# Patient Record
Sex: Female | Born: 1955
Health system: Southern US, Community
[De-identification: ages and names within clinical notes are randomized; demographics above are authoritative.]

## PROBLEM LIST (undated history)

## (undated) DIAGNOSIS — E785 Hyperlipidemia, unspecified: Secondary | ICD-10-CM

## (undated) DIAGNOSIS — M810 Age-related osteoporosis without current pathological fracture: Secondary | ICD-10-CM

## (undated) DIAGNOSIS — L719 Rosacea, unspecified: Secondary | ICD-10-CM

## (undated) HISTORY — DX: Hyperlipidemia, unspecified: E78.5

## (undated) HISTORY — PX: DENTAL SURGERY: SHX609

## (undated) HISTORY — DX: Age-related osteoporosis without current pathological fracture: M81.0

## (undated) HISTORY — DX: Rosacea, unspecified: L71.9

## (undated) HISTORY — PX: BREAST BIOPSY: SHX20

## (undated) HISTORY — PX: GALLBLADDER SURGERY: SHX652

---

## 2018-10-29 ENCOUNTER — Other Ambulatory Visit: Payer: Self-pay

## 2018-10-30 ENCOUNTER — Ambulatory Visit (INDEPENDENT_AMBULATORY_CARE_PROVIDER_SITE_OTHER): Payer: Medicare Other | Admitting: Family Medicine

## 2018-10-30 ENCOUNTER — Encounter: Payer: Self-pay | Admitting: Family Medicine

## 2018-10-30 ENCOUNTER — Ambulatory Visit (INDEPENDENT_AMBULATORY_CARE_PROVIDER_SITE_OTHER): Payer: Medicare Other

## 2018-10-30 VITALS — BP 103/62 | HR 70 | Temp 97.2°F | Ht 67.0 in | Wt 136.8 lb

## 2018-10-30 DIAGNOSIS — Z13820 Encounter for screening for osteoporosis: Secondary | ICD-10-CM

## 2018-10-30 DIAGNOSIS — L989 Disorder of the skin and subcutaneous tissue, unspecified: Secondary | ICD-10-CM | POA: Diagnosis not present

## 2018-10-30 DIAGNOSIS — Z1159 Encounter for screening for other viral diseases: Secondary | ICD-10-CM

## 2018-10-30 DIAGNOSIS — Z780 Asymptomatic menopausal state: Secondary | ICD-10-CM | POA: Diagnosis not present

## 2018-10-30 DIAGNOSIS — Z1239 Encounter for other screening for malignant neoplasm of breast: Secondary | ICD-10-CM | POA: Diagnosis not present

## 2018-10-30 DIAGNOSIS — Z1211 Encounter for screening for malignant neoplasm of colon: Secondary | ICD-10-CM | POA: Diagnosis not present

## 2018-10-30 DIAGNOSIS — Z23 Encounter for immunization: Secondary | ICD-10-CM

## 2018-10-30 DIAGNOSIS — Z0001 Encounter for general adult medical examination with abnormal findings: Secondary | ICD-10-CM

## 2018-10-30 DIAGNOSIS — Z0000 Encounter for general adult medical examination without abnormal findings: Secondary | ICD-10-CM | POA: Insufficient documentation

## 2018-10-30 DIAGNOSIS — Z136 Encounter for screening for cardiovascular disorders: Secondary | ICD-10-CM | POA: Diagnosis not present

## 2018-10-30 LAB — LIPID PANEL

## 2018-10-30 NOTE — Progress Notes (Signed)
BP 103/62   Pulse 70   Temp (!) 97.2 F (36.2 C) (Oral)   Ht '5\' 7"'$  (1.702 m)   Wt 136 lb 12.8 oz (62.1 kg)   BMI 21.43 kg/m    Subjective:    Patient ID: Melinda Bennett, female    DOB: March 02, 1956, 63 y.o.   MRN: [id number]  HPI: Melinda Bennett is a 63 y.o. female presenting on 10/30/2018 for New Patient (Initial Visit) ([facility]) and Establish Care   HPI Adult well exam Patient denies any chest pain, shortness of breath, headaches or vision issues, abdominal complaints, diarrhea, nausea, vomiting, or joint issues.  She has skin lesion on the side of her face near her nose that she is concerned about.  She would like this taken a look at.  It does have a rougher appearance.  She denies any other health issues besides that.  She is coming mainly to establish care with our office.  Relevant past medical, surgical, family and social history reviewed and updated as indicated. Interim medical history since our last visit reviewed. Allergies and medications reviewed and updated.  Review of Systems  Constitutional: Negative for chills and fever.  HENT: Negative for ear pain and tinnitus.   Eyes: Negative for pain.  Respiratory: Negative for cough, shortness of breath and wheezing.   Cardiovascular: Negative for chest pain, palpitations and leg swelling.  Gastrointestinal: Negative for abdominal pain, blood in stool, constipation and diarrhea.  Genitourinary: Negative for dysuria and hematuria.  Musculoskeletal: Negative for back pain and myalgias.  Skin: Negative for rash.  Neurological: Negative for dizziness, weakness and headaches.  Psychiatric/Behavioral: Negative for suicidal ideas.    Per HPI unless specifically indicated above  Social History   Socioeconomic History  . Marital status: Married    Spouse name: Not on file  . Number of children: Not on file  . Years of education: Not on file  . Highest education level: Not on file  Occupational History  . Not  on file  Social Needs  . Financial resource strain: Not on file  . Food insecurity    Worry: Not on file    Inability: Not on file  . Transportation needs    Medical: Not on file    Non-medical: Not on file  Tobacco Use  . Smoking status: Never Smoker  . Smokeless tobacco: Never Used  Substance and Sexual Activity  . Alcohol use: Never    Frequency: Never  . Drug use: Never  . Sexual activity: Yes    Comment: married  since 1982, 4 children all grown  Lifestyle  . Physical activity    Days per week: Not on file    Minutes per session: Not on file  . Stress: Not on file  Relationships  . Social Herbalist on phone: Not on file    Gets together: Not on file    Attends religious service: Not on file    Active member of club or organization: Not on file    Attends meetings of clubs or organizations: Not on file    Relationship status: Not on file  . Intimate partner violence    Fear of current or ex partner: Not on file    Emotionally abused: Not on file    Physically abused: Not on file    Forced sexual activity: Not on file  Other Topics Concern  . Not on file  Social History Narrative  . Not on file  Past Surgical History:  Procedure Laterality Date  . BREAST BIOPSY Left    1995  . GALLBLADDER SURGERY      Family History  Problem Relation Age of Onset  . Hypertension Mother   . Atrial fibrillation Mother     Allergies as of 10/30/2018   No Known Allergies     Medication List    as of October 30, 2018 11:59 PM   You have not been prescribed any medications.        Objective:    BP 103/62   Pulse 70   Temp (!) 97.2 F (36.2 C) (Oral)   Ht '5\' 7"'$  (1.702 m)   Wt 136 lb 12.8 oz (62.1 kg)   BMI 21.43 kg/m   Wt Readings from Last 3 Encounters:  10/30/18 136 lb 12.8 oz (62.1 kg)    Physical Exam Vitals signs and nursing note reviewed.  Constitutional:      General: She is not in acute distress.    Appearance: She is well-developed.  She is not diaphoretic.  Eyes:     Conjunctiva/sclera: Conjunctivae normal.  Cardiovascular:     Rate and Rhythm: Normal rate and regular rhythm.     Heart sounds: Normal heart sounds. No murmur.  Pulmonary:     Effort: Pulmonary effort is normal. No respiratory distress.     Breath sounds: Normal breath sounds. No wheezing.  Musculoskeletal: Normal range of motion.        General: No tenderness.  Skin:    General: Skin is warm and dry.     Findings: No rash.       Neurological:     Mental Status: She is alert and oriented to person, place, and time.     Coordination: Coordination normal.  Psychiatric:        Behavior: Behavior normal.         Assessment & Plan:   Problem List Items Addressed This Visit      Other   Well adult exam   Relevant Orders   CBC with Differential/Platelet (Completed)   CMP14+EGFR (Completed)   Lipid panel (Completed)    Other Visit Diagnoses    Colon cancer screening    -  Primary   Relevant Orders   Cologuard   Breast screening       Relevant Orders   MM 3D SCREEN BREAST BILATERAL   Precancerous skin lesion       Relevant Orders   Ambulatory referral to Dermatology   Need for hepatitis C screening test       Relevant Orders   Hepatitis C antibody (Completed)   Osteoporosis screening       Relevant Orders   DG [facility] DEXA    Will do a referral to dermatology for the skin lesion.  Patient is reluctant to do colonoscopy so we will schedule for Cologuard and she needs her mammogram and will do a DEXA scan as well  Follow up plan: Return in about 1 year (around 10/30/2019), or if symptoms worsen or fail to improve.  Caryl Pina, MD [facility] 11/05/2018, 9:09 PM

## 2018-10-31 LAB — LIPID PANEL
Chol/HDL Ratio: 2.8 ratio (ref 0.0–4.4)
Cholesterol, Total: 202 mg/dL — ABNORMAL HIGH (ref 100–199)
HDL: 71 mg/dL (ref >39)
LDL Calculated: 119 mg/dL — ABNORMAL HIGH (ref 0–99)
Triglycerides: 60 mg/dL (ref 0–149)
VLDL Cholesterol Cal: 12 mg/dL (ref 5–40)

## 2018-10-31 LAB — CMP14+EGFR
ALT: 20 IU/L (ref 0–32)
AST: 25 IU/L (ref 0–40)
Albumin/Globulin Ratio: 1.8 (ref 1.2–2.2)
Albumin: 4.4 g/dL (ref 3.8–4.8)
Alkaline Phosphatase: 84 IU/L (ref 39–117)
BUN/Creatinine Ratio: 24 (ref 12–28)
BUN: 15 mg/dL (ref 8–27)
Bilirubin Total: 0.3 mg/dL (ref 0.0–1.2)
CO2: 24 mmol/L (ref 20–29)
Calcium: 10.5 mg/dL — ABNORMAL HIGH (ref 8.7–10.3)
Chloride: 103 mmol/L (ref 96–106)
Creatinine, Ser: 0.62 mg/dL (ref 0.57–1.00)
GFR calc Af Amer: 109 mL/min/{1.73_m2} (ref >59)
GFR calc non Af Amer: 95 mL/min/{1.73_m2} (ref >59)
Globulin, Total: 2.4 g/dL (ref 1.5–4.5)
Glucose: 85 mg/dL (ref 65–99)
Potassium: 4 mmol/L (ref 3.5–5.2)
Sodium: 142 mmol/L (ref 134–144)
Total Protein: 6.8 g/dL (ref 6.0–8.5)

## 2018-10-31 LAB — HEPATITIS C ANTIBODY: Hep C Virus Ab: <0.1 s/co ratio (ref 0.0–0.9)

## 2018-10-31 LAB — CBC WITH DIFFERENTIAL/PLATELET
Basophils Absolute: 0 10*3/uL (ref 0.0–0.2)
Basos: 1 %
EOS (ABSOLUTE): 0.1 10*3/uL (ref 0.0–0.4)
Eos: 2 %
Hematocrit: 38.9 % (ref 34.0–46.6)
Hemoglobin: 12.9 g/dL (ref 11.1–15.9)
Immature Grans (Abs): 0 10*3/uL (ref 0.0–0.1)
Immature Granulocytes: 0 %
Lymphocytes Absolute: 1.4 10*3/uL (ref 0.7–3.1)
Lymphs: 24 %
MCH: 28.7 pg (ref 26.6–33.0)
MCHC: 33.2 g/dL (ref 31.5–35.7)
MCV: 87 fL (ref 79–97)
Monocytes Absolute: 0.5 10*3/uL (ref 0.1–0.9)
Monocytes: 8 %
Neutrophils Absolute: 3.8 10*3/uL (ref 1.4–7.0)
Neutrophils: 65 %
Platelets: 247 10*3/uL (ref 150–450)
RBC: 4.49 x10E6/uL (ref 3.77–5.28)
RDW: 12.8 % (ref 11.7–15.4)
WBC: 5.7 10*3/uL (ref 3.4–10.8)

## 2018-11-03 ENCOUNTER — Telehealth: Payer: Self-pay | Admitting: Family Medicine

## 2018-11-03 NOTE — Telephone Encounter (Signed)
Patient was seen 6/18 and got her first shingles injection.  States Friday she was having redness and a sore arm where injection was.  Saturday she states she was having body aches and joint pain. Sunday she states she felt better.  Today patient states she is having some warmth and redness but not near injection site. Should she be concerned?  Also states that she pulled off two deer ticks on her right thigh Friday.  States they were attached and that there is a red mark at the spot where the ticks were removed.  Should she be concerned about Lyme's?   Patient is out of town and will not return until Saturday.   Please Advise

## 2018-11-03 NOTE — Telephone Encounter (Signed)
lmtcb

## 2018-11-03 NOTE — Telephone Encounter (Signed)
The initial reaction sounds like a normal reaction to a vaccination, the immune system can cause that sore right where the injection was and a little bit of achiness and then it usually passes within a day or 2 which sounds very typical.  The fact that she is getting warmth and redness on another site is something that could be some other type of infection or something else going on, I would have her get this looked at as soon as possible.  If she is not here then may be going to an urgent care and having them look at it wherever she is at or sending some pictures to [place] via my chart so we can try and visualize it.  As far as the Lyme's I would have her continue to monitor the site and make sure that she does not develop any larger rashes or bull's-eye rashes and if anything changes to let us know.  I do not think the to correlate between the stuff on the arm and the tick bites and think there are 2 separate issues.  Please send pictures or go see an urgent care possible.

## 2018-11-10 DIAGNOSIS — X32XXXA Exposure to sunlight, initial encounter: Secondary | ICD-10-CM | POA: Diagnosis not present

## 2018-11-10 DIAGNOSIS — C44311 Basal cell carcinoma of skin of nose: Secondary | ICD-10-CM | POA: Diagnosis not present

## 2018-11-10 DIAGNOSIS — L570 Actinic keratosis: Secondary | ICD-10-CM | POA: Diagnosis not present

## 2018-11-10 DIAGNOSIS — Z85828 Personal history of other malignant neoplasm of skin: Secondary | ICD-10-CM | POA: Diagnosis not present

## 2018-11-10 DIAGNOSIS — Z1283 Encounter for screening for malignant neoplasm of skin: Secondary | ICD-10-CM | POA: Diagnosis not present

## 2018-11-10 DIAGNOSIS — Z08 Encounter for follow-up examination after completed treatment for malignant neoplasm: Secondary | ICD-10-CM | POA: Diagnosis not present

## 2018-11-11 DIAGNOSIS — Z1231 Encounter for screening mammogram for malignant neoplasm of breast: Secondary | ICD-10-CM | POA: Diagnosis not present

## 2018-11-12 ENCOUNTER — Other Ambulatory Visit: Payer: Self-pay

## 2018-11-13 ENCOUNTER — Encounter: Payer: Self-pay | Admitting: Family Medicine

## 2018-11-13 ENCOUNTER — Telehealth: Payer: Self-pay | Admitting: *Deleted

## 2018-11-13 ENCOUNTER — Ambulatory Visit (INDEPENDENT_AMBULATORY_CARE_PROVIDER_SITE_OTHER): Payer: Medicare Other | Admitting: Family Medicine

## 2018-11-13 ENCOUNTER — Other Ambulatory Visit: Payer: Self-pay

## 2018-11-13 VITALS — BP 125/72 | HR 86 | Temp 98.0°F | Ht 67.0 in | Wt 136.0 lb

## 2018-11-13 DIAGNOSIS — Z01411 Encounter for gynecological examination (general) (routine) with abnormal findings: Secondary | ICD-10-CM | POA: Diagnosis not present

## 2018-11-13 DIAGNOSIS — Z124 Encounter for screening for malignant neoplasm of cervix: Secondary | ICD-10-CM

## 2018-11-13 DIAGNOSIS — Z01419 Encounter for gynecological examination (general) (routine) without abnormal findings: Secondary | ICD-10-CM

## 2018-11-13 DIAGNOSIS — R21 Rash and other nonspecific skin eruption: Secondary | ICD-10-CM

## 2018-11-13 DIAGNOSIS — Z20822 Contact with and (suspected) exposure to covid-19: Secondary | ICD-10-CM

## 2018-11-13 DIAGNOSIS — W57XXXA Bitten or stung by nonvenomous insect and other nonvenomous arthropods, initial encounter: Secondary | ICD-10-CM | POA: Diagnosis not present

## 2018-11-13 DIAGNOSIS — R6889 Other general symptoms and signs: Secondary | ICD-10-CM | POA: Diagnosis not present

## 2018-11-13 NOTE — Telephone Encounter (Signed)
Pt scheduled for covid testing today @ 1:00 @ [facility]. Instructions given and order placed

## 2018-11-13 NOTE — Telephone Encounter (Signed)
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 No known exposures, but has traveled out of state.   [name] MR [id number]

## 2018-11-13 NOTE — Progress Notes (Signed)
Subjective:  Patient ID: Melinda Bennett, female    DOB: 05-13-1956, 63 y.o.   MRN: [id number]  Chief Complaint:  pap only   HPI: Melinda Bennett is a 63 y.o. female presenting on 11/13/2018 for pap only  Pt presents today for her PAP. Pt states she has been postmenopausal for several years, around 10 years. Denies any vaginal symptoms. Pt also reports she removed a tick from her right upper thigh about 2 weeks ago. Pt states after this she has myalgias. No fever, chills, abdominal pain, n/v/d, confusion, headache, or fatigue. She states she did have a red rash at the site of the bite. She states she still has a slight rash but no other symptoms.   Relevant past medical, surgical, family, and social history reviewed and updated as indicated.  Allergies and medications reviewed and updated.   History reviewed. No pertinent past medical history.  Past Surgical History:  Procedure Laterality Date  . BREAST BIOPSY Left    1995  . GALLBLADDER SURGERY      Social History   Socioeconomic History  . Marital status: Married    Spouse name: Not on file  . Number of children: Not on file  . Years of education: Not on file  . Highest education level: Not on file  Occupational History  . Not on file  Social Needs  . Financial resource strain: Not on file  . Food insecurity    Worry: Not on file    Inability: Not on file  . Transportation needs    Medical: Not on file    Non-medical: Not on file  Tobacco Use  . Smoking status: Never Smoker  . Smokeless tobacco: Never Used  Substance and Sexual Activity  . Alcohol use: Never    Frequency: Never  . Drug use: Never  . Sexual activity: Yes    Comment: married  since 1982, 4 children all grown  Lifestyle  . Physical activity    Days per week: Not on file    Minutes per session: Not on file  . Stress: Not on file  Relationships  . Social Herbalist on phone: Not on file    Gets together: Not on file    Attends religious  service: Not on file    Active member of club or organization: Not on file    Attends meetings of clubs or organizations: Not on file    Relationship status: Not on file  . Intimate partner violence    Fear of current or ex partner: Not on file    Emotionally abused: Not on file    Physically abused: Not on file    Forced sexual activity: Not on file  Other Topics Concern  . Not on file  Social History Narrative  . Not on file    No outpatient encounter medications on file as of 11/13/2018.   No facility-administered encounter medications on file as of 11/13/2018.     No Known Allergies  Review of Systems  Constitutional: Negative for activity change, appetite change, chills, fatigue, fever and unexpected weight change.  Respiratory: Negative for cough, chest tightness, shortness of breath and wheezing.   Cardiovascular: Negative for chest pain, palpitations and leg swelling.  Gastrointestinal: Negative for abdominal pain, constipation, diarrhea, nausea and vomiting.  Genitourinary: Negative for decreased urine volume, difficulty urinating, dyspareunia, dysuria, enuresis, flank pain, frequency, genital sores, hematuria, pelvic pain, urgency, vaginal bleeding, vaginal discharge and vaginal pain.  Musculoskeletal:  Positive for myalgias. Negative for arthralgias.  Skin: Positive for rash.  Neurological: Negative for dizziness, tremors, seizures, syncope, facial asymmetry, speech difficulty, weakness, light-headedness, numbness and headaches.  Psychiatric/Behavioral: Negative for confusion.  All other systems reviewed and are negative.       Objective:  BP 125/72   Pulse 86   Temp 98 F (36.7 C) (Oral)   Ht '5\' 7"'$  (1.702 m)   Wt 136 lb (61.7 kg)   BMI 21.30 kg/m    Wt Readings from Last 3 Encounters:  11/13/18 136 lb (61.7 kg)  10/30/18 136 lb 12.8 oz (62.1 kg)    Physical Exam Vitals signs and nursing note reviewed.  Constitutional:      General: She is not in acute  distress.    Appearance: Normal appearance. She is well-developed and well-groomed. She is not ill-appearing, toxic-appearing or diaphoretic.  HENT:     Head: Normocephalic and atraumatic.     Jaw: There is normal jaw occlusion.     Right Ear: Hearing normal.     Left Ear: Hearing normal.     Nose: Nose normal.     Mouth/Throat:     Lips: Pink.     Mouth: Mucous membranes are moist.     Pharynx: Oropharynx is clear. Uvula midline.  Eyes:     General: Lids are normal.     Extraocular Movements: Extraocular movements intact.     Conjunctiva/sclera: Conjunctivae normal.     Pupils: Pupils are equal, round, and reactive to light.  Neck:     Musculoskeletal: Normal range of motion and neck supple.     Thyroid: No thyroid mass, thyromegaly or thyroid tenderness.     Vascular: No carotid bruit or JVD.     Trachea: Trachea and phonation normal.  Cardiovascular:     Rate and Rhythm: Normal rate and regular rhythm.     Chest Wall: PMI is not displaced.     Pulses: Normal pulses.     Heart sounds: Normal heart sounds. No murmur. No friction rub. No gallop.   Pulmonary:     Effort: Pulmonary effort is normal. No respiratory distress.     Breath sounds: Normal breath sounds. No wheezing.  Abdominal:     General: Bowel sounds are normal. There is no distension or abdominal bruit.     Palpations: Abdomen is soft. There is no hepatomegaly or splenomegaly.     Tenderness: There is no abdominal tenderness. There is no right CVA tenderness or left CVA tenderness.     Hernia: No hernia is present. There is no hernia in the left inguinal area or right inguinal area.  Genitourinary:    General: Normal vulva.     Exam position: Lithotomy position.     Pubic Area: No rash or pubic lice.      Labia:        Right: No rash, tenderness, lesion or injury.        Left: No rash, tenderness, lesion or injury.      Urethra: No prolapse, urethral pain, urethral swelling or urethral lesion.     Vagina:  Normal.     Cervix: Normal.     Uterus: Normal.      Adnexa: Right adnexa normal and left adnexa normal.     Rectum: Normal.  Musculoskeletal: Normal range of motion.     Right lower leg: No edema.     Left lower leg: No edema.  Lymphadenopathy:     Cervical: No cervical  adenopathy.     Lower Body: No right inguinal adenopathy. No left inguinal adenopathy.  Skin:    General: Skin is warm and dry.     Capillary Refill: Capillary refill takes less than 2 seconds.     Coloration: Skin is not cyanotic, jaundiced or pale.     Findings: Rash present.       Neurological:     General: No focal deficit present.     Mental Status: She is alert and oriented to person, place, and time.     Cranial Nerves: Cranial nerves are intact.     Sensory: Sensation is intact.     Motor: Motor function is intact.     Coordination: Coordination is intact.     Gait: Gait is intact.     Deep Tendon Reflexes: Reflexes are normal and symmetric.  Psychiatric:        Attention and Perception: Attention and perception normal.        Mood and Affect: Mood and affect normal.        Speech: Speech normal.        Behavior: Behavior normal. Behavior is cooperative.        Thought Content: Thought content normal.        Cognition and Memory: Cognition and memory normal.        Judgment: Judgment normal.     Results for orders placed or performed in visit on 10/30/18  Hepatitis C antibody  Result Value Ref Range   Hep C Virus Ab <0.1 0.0 - 0.9 s/co ratio  CBC with Differential/Platelet  Result Value Ref Range   WBC 5.7 3.4 - 10.8 x10E3/uL   RBC 4.49 3.77 - 5.28 x10E6/uL   Hemoglobin 12.9 11.1 - 15.9 g/dL   Hematocrit 38.9 34.0 - 46.6 %   MCV 87 79 - 97 fL   MCH 28.7 26.6 - 33.0 pg   MCHC 33.2 31.5 - 35.7 g/dL   RDW 12.8 11.7 - 15.4 %   Platelets 247 150 - 450 x10E3/uL   Neutrophils 65 Not Estab. %   Lymphs 24 Not Estab. %   Monocytes 8 Not Estab. %   Eos 2 Not Estab. %   Basos 1 Not Estab. %    Neutrophils Absolute 3.8 1.4 - 7.0 x10E3/uL   Lymphocytes Absolute 1.4 0.7 - 3.1 x10E3/uL   Monocytes Absolute 0.5 0.1 - 0.9 x10E3/uL   EOS (ABSOLUTE) 0.1 0.0 - 0.4 x10E3/uL   Basophils Absolute 0.0 0.0 - 0.2 x10E3/uL   Immature Granulocytes 0 Not Estab. %   Immature Grans (Abs) 0.0 0.0 - 0.1 x10E3/uL  CMP14+EGFR  Result Value Ref Range   Glucose 85 65 - 99 mg/dL   BUN 15 8 - 27 mg/dL   Creatinine, Ser 0.62 0.57 - 1.00 mg/dL   GFR calc non Af Amer 95 >59 mL/min/1.73   GFR calc Af Amer 109 >59 mL/min/1.73   BUN/Creatinine Ratio 24 12 - 28   Sodium 142 134 - 144 mmol/L   Potassium 4.0 3.5 - 5.2 mmol/L   Chloride 103 96 - 106 mmol/L   CO2 24 20 - 29 mmol/L   Calcium 10.5 (H) 8.7 - 10.3 mg/dL   Total Protein 6.8 6.0 - 8.5 g/dL   Albumin 4.4 3.8 - 4.8 g/dL   Globulin, Total 2.4 1.5 - 4.5 g/dL   Albumin/Globulin Ratio 1.8 1.2 - 2.2   Bilirubin Total 0.3 0.0 - 1.2 mg/dL   Alkaline Phosphatase 84 39 - 117  IU/L   AST 25 0 - 40 IU/L   ALT 20 0 - 32 IU/L  Lipid panel  Result Value Ref Range   Cholesterol, Total 202 (H) 100 - 199 mg/dL   Triglycerides 60 0 - 149 mg/dL   HDL 71 >39 mg/dL   VLDL Cholesterol Cal 12 5 - 40 mg/dL   LDL Calculated 119 (H) 0 - 99 mg/dL   Chol/HDL Ratio 2.8 0.0 - 4.4 ratio       Pertinent labs & imaging results that were available during my care of the patient were reviewed by me and considered in my medical decision making.  Assessment & Plan:  Jazyah was seen today for pap only.  Diagnoses and all orders for this visit:  Well woman exam with routine gynecological exam Screening for cervical cancer -     IGP, Aptima HPV, rfx 16/18,45  Rash in adult Tick bite, initial encounter Resolution of body aches 1 week ago. Still has slight red raised rash to right upper thigh. Will check titers today. Will determine if additional treatment is warranted once labs result. Pt aware to report any new or worsening symptoms.  -     Lyme Ab/Western Blot Reflex -      Rocky mtn spotted fvr abs pnl(IgG+IgM)       Continue all other maintenance medications.  Follow up plan: Return if symptoms worsen or fail to improve.    The above assessment and management plan was discussed with the patient. The patient verbalized understanding of and has agreed to the management plan. Patient is aware to call the clinic if symptoms persist or worsen. Patient is aware when to return to the clinic for a follow-up visit. Patient educated on when it is appropriate to go to the emergency department.   Monia Pouch, FNP-C [facility] [phone]

## 2018-11-17 DIAGNOSIS — Z1211 Encounter for screening for malignant neoplasm of colon: Secondary | ICD-10-CM | POA: Diagnosis not present

## 2018-11-18 LAB — LYME AB/WESTERN BLOT REFLEX
LYME DISEASE AB, QUANT, IGM: <0.8 index (ref 0.00–0.79)
Lyme IgG/IgM Ab: <0.91 {ISR} (ref 0.00–0.90)

## 2018-11-18 LAB — ROCKY MTN SPOTTED FVR ABS PNL(IGG+IGM)
RMSF IgG: POSITIVE — AB
RMSF IgM: 0.75 index (ref 0.00–0.89)

## 2018-11-18 LAB — RMSF, IGG, IFA: RMSF, IGG, IFA: <1:64 {titer}

## 2018-11-19 LAB — NOVEL CORONAVIRUS, NAA: SARS-CoV-2, NAA: NOT DETECTED

## 2018-11-21 LAB — IGP, APTIMA HPV, RFX 16/18,45: HPV Aptima: NEGATIVE

## 2018-11-28 LAB — COLOGUARD: Cologuard: NEGATIVE

## 2018-12-23 DIAGNOSIS — M810 Age-related osteoporosis without current pathological fracture: Secondary | ICD-10-CM | POA: Diagnosis not present

## 2019-01-08 ENCOUNTER — Encounter: Payer: Self-pay | Admitting: Family Medicine

## 2019-01-16 ENCOUNTER — Encounter: Payer: Self-pay | Admitting: Family Medicine

## 2019-01-16 ENCOUNTER — Ambulatory Visit (INDEPENDENT_AMBULATORY_CARE_PROVIDER_SITE_OTHER): Payer: Medicare Other | Admitting: Family Medicine

## 2019-01-16 DIAGNOSIS — M810 Age-related osteoporosis without current pathological fracture: Secondary | ICD-10-CM | POA: Insufficient documentation

## 2019-01-16 MED ORDER — ALENDRONATE SODIUM 70 MG PO TABS: 70.0000 mg | ORAL_TABLET | ORAL | 11 refills | Status: DC

## 2019-01-16 NOTE — Progress Notes (Signed)
   Virtual Visit via telephone Note  I connected with Melinda Bennett on 01/16/19 at 1614 by telephone and verified that I am speaking with the correct person using two identifiers. Melinda Bennett is currently located at home and no other people are currently with her during visit. The provider, Fransisca Kaufmann Dettinger, MD is located in their office at time of visit.  Call ended at 1640  I discussed the limitations, risks, security and privacy concerns of performing an evaluation and management service by telephone and the availability of in person appointments. I also discussed with the patient that there may be a patient responsible charge related to this service. The patient expressed understanding and agreed to proceed.   History and Present Illness: Osteoporosis Patient had a bone scan that showed that she has osteoporosis with a T score of -4.3.  We discussed the importance of this and the risks of osteoporosis and she ends understanding and will start calcium and vitamin D and will also start the Fosamax to see if she can get it.  She denies any major fractures or falls and really denies any symptoms and she tries to stay active.  Currently she is up [place] taking care of her mother who is ill and keeps active physically helping care for her.  No diagnosis found.  No outpatient encounter medications on file as of 01/16/2019.   No facility-administered encounter medications on file as of 01/16/2019.     Review of Systems  Constitutional: Negative for chills and fever.  Eyes: Negative for visual disturbance.  Respiratory: Negative for chest tightness and shortness of breath.   Cardiovascular: Negative for chest pain and leg swelling.  Musculoskeletal: Negative for back pain and gait problem.  Skin: Negative for rash.  Psychiatric/Behavioral: Negative for agitation and behavioral problems.  All other systems reviewed and are negative.   Observations/Objective: Patient sounds comfortable and in no  acute distress  Assessment and Plan: Problem List Items Addressed This Visit      Musculoskeletal and Integument   Osteoporosis   Relevant Medications   alendronate (FOSAMAX) 70 MG tablet       Follow Up Instructions: Follow up for yearly in February of next year We will start Fosamax and instructed her on how to take it and what to watch for and she will follow-up for her normal yearly.   I discussed the assessment and treatment plan with the patient. The patient was provided an opportunity to ask questions and all were answered. The patient agreed with the plan and demonstrated an understanding of the instructions.   The patient was advised to call back or seek an in-person evaluation if the symptoms worsen or if the condition fails to improve as anticipated.  The above assessment and management plan was discussed with the patient. The patient verbalized understanding of and has agreed to the management plan. Patient is aware to call the clinic if symptoms persist or worsen. Patient is aware when to return to the clinic for a follow-up visit. Patient educated on when it is appropriate to go to the emergency department.    I provided 16 minutes of non-face-to-face time during this encounter.    Worthy Rancher, MD

## 2019-01-20 ENCOUNTER — Telehealth: Payer: Self-pay | Admitting: Family Medicine

## 2019-01-20 MED ORDER — ALENDRONATE SODIUM 70 MG PO TABS: 70.0000 mg | ORAL_TABLET | ORAL | 3 refills | Status: DC

## 2019-01-20 NOTE — Telephone Encounter (Signed)
Refill sent to mail order. 

## 2019-01-23 ENCOUNTER — Telehealth: Payer: Self-pay | Admitting: Family Medicine

## 2019-01-23 MED ORDER — ALENDRONATE SODIUM 70 MG PO TABS: 70.0000 mg | ORAL_TABLET | ORAL | 3 refills | Status: DC

## 2019-01-23 NOTE — Telephone Encounter (Signed)
done

## 2019-02-11 DIAGNOSIS — Z23 Encounter for immunization: Secondary | ICD-10-CM | POA: Diagnosis not present

## 2019-03-30 ENCOUNTER — Other Ambulatory Visit: Payer: Self-pay

## 2019-03-31 ENCOUNTER — Ambulatory Visit (INDEPENDENT_AMBULATORY_CARE_PROVIDER_SITE_OTHER): Payer: Medicare Other

## 2019-03-31 DIAGNOSIS — Z23 Encounter for immunization: Secondary | ICD-10-CM | POA: Diagnosis not present

## 2019-04-03 ENCOUNTER — Other Ambulatory Visit: Payer: Self-pay

## 2019-04-03 DIAGNOSIS — Z20828 Contact with and (suspected) exposure to other viral communicable diseases: Secondary | ICD-10-CM | POA: Diagnosis not present

## 2019-04-03 DIAGNOSIS — Z20822 Contact with and (suspected) exposure to covid-19: Secondary | ICD-10-CM

## 2019-04-06 LAB — NOVEL CORONAVIRUS, NAA: SARS-CoV-2, NAA: NOT DETECTED

## 2019-08-31 DIAGNOSIS — Z23 Encounter for immunization: Secondary | ICD-10-CM | POA: Diagnosis not present

## 2019-09-28 DIAGNOSIS — Z23 Encounter for immunization: Secondary | ICD-10-CM | POA: Diagnosis not present

## 2019-10-19 DIAGNOSIS — C44519 Basal cell carcinoma of skin of other part of trunk: Secondary | ICD-10-CM | POA: Diagnosis not present

## 2019-10-19 DIAGNOSIS — X32XXXD Exposure to sunlight, subsequent encounter: Secondary | ICD-10-CM | POA: Diagnosis not present

## 2019-10-19 DIAGNOSIS — L718 Other rosacea: Secondary | ICD-10-CM | POA: Diagnosis not present

## 2019-10-19 DIAGNOSIS — Z85828 Personal history of other malignant neoplasm of skin: Secondary | ICD-10-CM | POA: Diagnosis not present

## 2019-10-19 DIAGNOSIS — Z08 Encounter for follow-up examination after completed treatment for malignant neoplasm: Secondary | ICD-10-CM | POA: Diagnosis not present

## 2019-10-19 DIAGNOSIS — L570 Actinic keratosis: Secondary | ICD-10-CM | POA: Diagnosis not present

## 2019-10-19 DIAGNOSIS — L820 Inflamed seborrheic keratosis: Secondary | ICD-10-CM | POA: Diagnosis not present

## 2019-11-02 ENCOUNTER — Ambulatory Visit (INDEPENDENT_AMBULATORY_CARE_PROVIDER_SITE_OTHER): Payer: Medicare Other | Admitting: *Deleted

## 2019-11-02 DIAGNOSIS — Z0000 Encounter for general adult medical examination without abnormal findings: Secondary | ICD-10-CM | POA: Diagnosis not present

## 2019-11-02 NOTE — Progress Notes (Addendum)
MEDICARE ANNUAL WELLNESS VISIT  11/02/2019  Telephone Visit Disclaimer This Medicare AWV was conducted by telephone due to national recommendations for restrictions regarding the COVID-19 Pandemic (e.g. social distancing).  I verified, using two identifiers, that I am speaking with Melinda Bennett or their authorized healthcare agent. I discussed the limitations, risks, security, and privacy concerns of performing an evaluation and management service by telephone and the potential availability of an in-person appointment in the future. The patient expressed understanding and agreed to proceed.   Subjective:  Melinda Bennett is a 64 y.o. female patient of Dettinger, Fransisca Kaufmann, MD who had a Medicare Annual Wellness Visit today via telephone. Aanchal is Retired and lives with their spouse. she has 4 children. she reports that she is socially active and does interact with friends/family regularly. she is minimally physically active and enjoys Music therapist.  Patient Care Team: Dettinger, Fransisca Kaufmann, MD as PCP - General (Family Medicine)  Advanced Directives 11/02/2019  Does Patient Have a Medical Advance Directive? Yes  Type of Advance Directive [place]  Does patient want to make changes to medical advance directive? No - Patient declined  Copy of [place] in Chart? No - copy requested    Hospital Utilization Over the Past 12 Months: # of hospitalizations or ER visits: 0 # of surgeries: 0  Review of Systems    Patient reports that her overall health is unchanged compared to last year.  History obtained from chart review  Patient Reported Readings (BP, Pulse, CBG, Weight, etc) none  Pain Assessment Pain : No/denies pain     Current Medications & Allergies (verified) Allergies as of 11/02/2019   No Known Allergies      Medication List        Accurate as of November 02, 2019  9:29 AM. If you have any questions, ask your nurse or doctor.           STOP taking these medications    alendronate 70 MG tablet Commonly known as: FOSAMAX        History (reviewed): Past Medical History:  Diagnosis Date   Osteoporosis    Rosacea    Past Surgical History:  Procedure Laterality Date   BREAST BIOPSY Left    1995   GALLBLADDER SURGERY     Family History  Problem Relation Age of Onset   Hypertension Mother    Atrial fibrillation Mother    Social History   Socioeconomic History   Marital status: Married    Spouse name: Acupuncturist   Number of children: 4   Years of education: 16   Highest education level: Master's degree (e.g., MA, MS, MEng, MEd, MSW, MBA)  Occupational History   Not on file  Tobacco Use   Smoking status: Never Smoker   Smokeless tobacco: Never Used  Vaping Use   Vaping Use: Never used  Substance and Sexual Activity   Alcohol use: Never   Drug use: Never   Sexual activity: Yes    Comment: married  since 1982, 4 children all grown  Other Topics Concern   Not on file  Social History Narrative   Not on file   Social Determinants of Health   Financial Resource Strain: Low Risk    Difficulty of Paying Living Expenses: Not hard at all  Food Insecurity: No Food Insecurity   Worried About Charity fundraiser in the Last Year: Never true   [place] in the  Last Year: Never true  Transportation Needs: No Transportation Needs   Lack of Transportation (Medical): No   Lack of Transportation (Non-Medical): No  Physical Activity: Inactive   Days of Exercise per Week: 0 days   Minutes of Exercise per Session: 0 min  Stress: No Stress Concern Present   Feeling of Stress : Not at all  Social Connections: Socially Integrated   Frequency of Communication with Friends and Family: More than three times a week   Frequency of Social Gatherings with Friends and Family: More than three times a week   Attends Religious Services: More than 4 times per year   Active Member of [employer] or Organizations: Yes    Attends Archivist Meetings: More than 4 times per year   Marital Status: Married    Activities of Daily Living In your present state of health, do you have any difficulty performing the following activities: 11/02/2019  Hearing? N  Vision? N  Comment wears RX glasses-last eye exam 04/2019  Difficulty concentrating or making decisions? N  Walking or climbing stairs? N  Dressing or bathing? N  Doing errands, shopping? N  Preparing Food and eating ? N  Using the Toilet? N  In the past six months, have you accidently leaked urine? N  Do you have problems with loss of bowel control? N  Managing your Medications? N  Managing your Finances? N  Housekeeping or managing your Housekeeping? N  Some recent data might be hidden    Patient Education/ Literacy How often do you need to have someone help you when you read instructions, pamphlets, or other written materials from your doctor or pharmacy?: 1 - Never What is the last grade level you completed in school?: Masters Degree  Exercise Current Exercise Habits: The patient does not participate in regular exercise at present, Exercise limited by: None identified  Diet Patient reports consuming 3 meals a day and 1 snack(s) a day Patient reports that her primary diet is: Regular Patient reports that she does have regular access to food.   Depression Screen PHQ 2/9 Scores 11/02/2019 11/13/2018 10/30/2018  PHQ - 2 Score 0 0 0     Fall Risk Fall Risk  11/02/2019 11/13/2018 10/30/2018  Falls in the past year? 0 0 0     Objective:  Melinda Bennett seemed alert and oriented and she participated appropriately during our telephone visit.  Blood Pressure Weight BMI  BP Readings from Last 3 Encounters:  11/13/18 125/72  10/30/18 103/62   Wt Readings from Last 3 Encounters:  11/13/18 136 lb (61.7 kg)  10/30/18 136 lb 12.8 oz (62.1 kg)   BMI Readings from Last 1 Encounters:  11/13/18 21.30 kg/m    *Unable to obtain current vital signs,  weight, and BMI due to telephone visit type  Hearing/Vision  Melinda Bennett did not seem to have difficulty with hearing/understanding during the telephone conversation Reports that she has had a formal eye exam by an eye care professional within the past year Reports that she has not had a formal hearing evaluation within the past year *Unable to fully assess hearing and vision during telephone visit type  Cognitive Function: 6CIT Screen 11/02/2019  What Year? 0 points  What month? 0 points  What time? 0 points  Count back from 20 0 points  Months in reverse 0 points  Repeat phrase 0 points  Total Score 0   (Normal:0-7, Significant for Dysfunction: >8)  Normal Cognitive Function Screening: Yes   Immunization &  Health Maintenance Record Immunization History  Administered Date(s) Administered   Influenza,inj,Quad PF,6+ Mos 02/27/2018   Zoster Recombinat (Shingrix) 10/30/2018, 03/31/2019    Health Maintenance  Topic Date Due   COVID-19 Vaccine (1) Never done   TETANUS/TDAP  Never done   COLONOSCOPY  Never done   PNA vac Low Risk Adult (1 of 2 - PCV13) Never done   INFLUENZA VACCINE  12/13/2019   MAMMOGRAM  11/10/2020   DEXA SCAN  Completed   Hepatitis C Screening  Completed       Assessment  This is a routine wellness examination for Melinda Bennett.  Health Maintenance: Due or Overdue Health Maintenance Due  Topic Date Due   COVID-19 Vaccine (1) Never done   TETANUS/TDAP  Never done   COLONOSCOPY  Never done   PNA vac Low Risk Adult (1 of 2 - PCV13) Never done    Melinda Bennett does not need a referral for Community Assistance: Care Management:   no Social Work:    no Prescription Assistance:  no Nutrition/Diabetes Education:  no   Plan:  Personalized Goals Goals Addressed             This Visit's Progress    Increase physical activity         Personalized Health Maintenance & Screening Recommendations  Prevnar 13 TDAP Advanced Directives  Lung Cancer  Screening Recommended: no (Low Dose CT Chest recommended if Age 11-80 years, 30 pack-year currently smoking OR have quit w/in past 15 years) Hepatitis C Screening recommended: no HIV Screening recommended: no  Advanced Directives: Written information was not prepared per patient's request.  Referrals & Orders No orders of the defined types were placed in this encounter.   Follow-up Plan Follow-up with Dettinger, Fransisca Kaufmann, MD as planned Consider TDAP and Prevnar 13 at your next visit with your PCP Bring a copy of your Advanced Directives in for our records Bring a copy of your COVID vaccine card in for our records   I have personally reviewed and noted the following in the patient's chart:   Medical and social history Use of alcohol, tobacco or illicit drugs  Current medications and supplements Functional ability and status Nutritional status Physical activity Advanced directives List of other physicians Hospitalizations, surgeries, and ER visits in previous 12 months Vitals Screenings to include cognitive, depression, and falls Referrals and appointments  In addition, I have reviewed and discussed with Melinda Bennett certain preventive protocols, quality metrics, and best practice recommendations. A written personalized care plan for preventive services as well as general preventive health recommendations is available and can be mailed to the patient at her request.      Alvie Fowles, Donny Pique, LPN  5/45/6256    I have reviewed and agree with the above AWV documentation.   Evelina Dun, FNP

## 2019-11-02 NOTE — Patient Instructions (Signed)
Preventive Care 66 Years and Older, Female Preventive care refers to lifestyle choices and visits with your health care provider that can promote health and wellness. This includes:  A yearly physical exam. This is also called an annual well check.  Regular dental and eye exams.  Immunizations.  Screening for certain conditions.  Healthy lifestyle choices, such as diet and exercise. What can I expect for my preventive care visit? Physical exam Your health care provider will check:  Height and weight. These may be used to calculate body mass index (BMI), which is a measurement that tells if you are at a healthy weight.  Heart rate and blood pressure.  Your skin for abnormal spots. Counseling Your health care provider may ask you questions about:  Alcohol, tobacco, and drug use.  Emotional well-being.  Home and relationship well-being.  Sexual activity.  Eating habits.  History of falls.  Memory and ability to understand (cognition).  Work and work Statistician.  Pregnancy and menstrual history. What immunizations do I need?  Influenza (flu) vaccine  This is recommended every year. Tetanus, diphtheria, and pertussis (Tdap) vaccine  You may need a Td booster every 10 years. Varicella (chickenpox) vaccine  You may need this vaccine if you have not already been vaccinated. Zoster (shingles) vaccine  You may need this after age 33. Pneumococcal conjugate (PCV13) vaccine  One dose is recommended after age 33. Pneumococcal polysaccharide (PPSV23) vaccine  One dose is recommended after age 72. Measles, mumps, and rubella (MMR) vaccine  You may need at least one dose of MMR if you were born in 1957 or later. You may also need a second dose. Meningococcal conjugate (MenACWY) vaccine  You may need this if you have certain conditions. Hepatitis A vaccine  You may need this if you have certain conditions or if you travel or work in places where you may be exposed  to hepatitis A. Hepatitis B vaccine  You may need this if you have certain conditions or if you travel or work in places where you may be exposed to hepatitis B. Haemophilus influenzae type b (Hib) vaccine  You may need this if you have certain conditions. You may receive vaccines as individual doses or as more than one vaccine together in one shot (combination vaccines). Talk with your health care provider about the risks and benefits of combination vaccines. What tests do I need? Blood tests  Lipid and cholesterol levels. These may be checked every 5 years, or more frequently depending on your overall health.  Hepatitis C test.  Hepatitis B test. Screening  Lung cancer screening. You may have this screening every year starting at age 39 if you have a 30-pack-year history of smoking and currently smoke or have quit within the past 15 years.  Colorectal cancer screening. All adults should have this screening starting at age 36 and continuing until age 15. Your health care provider may recommend screening at age 23 if you are at increased risk. You will have tests every 1-10 years, depending on your results and the type of screening test.  Diabetes screening. This is done by checking your blood sugar (glucose) after you have not eaten for a while (fasting). You may have this done every 1-3 years.  Mammogram. This may be done every 1-2 years. Talk with your health care provider about how often you should have regular mammograms.  BRCA-related cancer screening. This may be done if you have a family history of breast, ovarian, tubal, or peritoneal cancers.  Other tests  Sexually transmitted disease (STD) testing.  Bone density scan. This is done to screen for osteoporosis. You may have this done starting at age 44. Follow these instructions at home: Eating and drinking  Eat a diet that includes fresh fruits and vegetables, whole grains, lean protein, and low-fat dairy products. Limit  your intake of foods with high amounts of sugar, saturated fats, and salt.  Take vitamin and mineral supplements as recommended by your health care provider.  Do not drink alcohol if your health care provider tells you not to drink.  If you drink alcohol: ? Limit how much you have to 0-1 drink a day. ? Be aware of how much alcohol is in your drink. In the [place] one drink equals one 12 oz bottle of beer (355 mL), one 5 oz glass of wine (148 mL), or one 1 oz glass of hard liquor (44 mL). Lifestyle  Take daily care of your teeth and gums.  Stay active. Exercise for at least 30 minutes on 5 or more days each week.  Do not use any products that contain nicotine or tobacco, such as cigarettes, e-cigarettes, and chewing tobacco. If you need help quitting, ask your health care provider.  If you are sexually active, practice safe sex. Use a condom or other form of protection in order to prevent STIs (sexually transmitted infections).  Talk with your health care provider about taking a low-dose aspirin or statin. What's next?  Go to your health care provider once a year for a well check visit.  Ask your health care provider how often you should have your eyes and teeth checked.  Stay up to date on all vaccines. This information is not intended to replace advice given to you by your health care provider. Make sure you discuss any questions you have with your health care provider. Document Revised: 04/24/2018 Document Reviewed: 04/24/2018 Elsevier Patient Education  2020 [employer].

## 2020-02-08 ENCOUNTER — Encounter: Payer: Self-pay | Admitting: Family Medicine

## 2020-02-08 ENCOUNTER — Other Ambulatory Visit: Payer: Self-pay

## 2020-02-08 ENCOUNTER — Ambulatory Visit (INDEPENDENT_AMBULATORY_CARE_PROVIDER_SITE_OTHER): Payer: Medicare Other | Admitting: Family Medicine

## 2020-02-08 VITALS — BP 100/66 | HR 85 | Temp 98.3°F | Ht 67.0 in | Wt 132.2 lb

## 2020-02-08 DIAGNOSIS — Z13220 Encounter for screening for lipoid disorders: Secondary | ICD-10-CM

## 2020-02-08 DIAGNOSIS — M810 Age-related osteoporosis without current pathological fracture: Secondary | ICD-10-CM

## 2020-02-08 DIAGNOSIS — Z131 Encounter for screening for diabetes mellitus: Secondary | ICD-10-CM

## 2020-02-08 DIAGNOSIS — Z23 Encounter for immunization: Secondary | ICD-10-CM | POA: Diagnosis not present

## 2020-02-08 DIAGNOSIS — Z0001 Encounter for general adult medical examination with abnormal findings: Secondary | ICD-10-CM | POA: Diagnosis not present

## 2020-02-08 DIAGNOSIS — Z0000 Encounter for general adult medical examination without abnormal findings: Secondary | ICD-10-CM

## 2020-02-08 NOTE — Progress Notes (Signed)
BP 100/66   Pulse 85   Temp 98.3 F (36.8 C)   Ht 5' 7" (1.702 m)   Wt 132 lb 4 oz (60 kg)   SpO2 97%   BMI 20.71 kg/m    Subjective:   Patient ID: Melinda Bennett, female    DOB: 11-28-1955, 64 y.o.   MRN: [id number]  HPI: Melinda Bennett is a 64 y.o. female presenting on 02/08/2020 for Medical Management of Chronic Issues   HPI Well adult exam and physical and recheck of chronic medical issues. Patient denies any chest pain, shortness of breath, headaches or vision issues, abdominal complaints, diarrhea, nausea, vomiting.  Patient does say she is having a little bit of problems with her right shoulder and some numbness that shoots down from her right neck down to her arm at times when she is in certain positions.  She says her shoulder will bother her at times when she is sleeping or when she is working with that.  She says she has been doing a lot of renovations around the house over the past couple years and her shoulder just causes her mild discomfort frequently.  Patient denies any other health issues or concerns.  Relevant past medical, surgical, family and social history reviewed and updated as indicated. Interim medical history since our last visit reviewed. Allergies and medications reviewed and updated.  Review of Systems  Constitutional: Negative for chills and fever.  Eyes: Negative for redness and visual disturbance.  Respiratory: Negative for chest tightness and shortness of breath.   Cardiovascular: Negative for chest pain and leg swelling.  Genitourinary: Negative for difficulty urinating and dysuria.  Musculoskeletal: Negative for back pain and gait problem.  Skin: Negative for rash.  Neurological: Negative for light-headedness and headaches.  Psychiatric/Behavioral: Negative for agitation, behavioral problems and dysphoric mood. The patient is not nervous/anxious.   All other systems reviewed and are negative.   Per HPI unless specifically indicated  above   Allergies as of 02/08/2020   No Known Allergies     Medication List       Accurate as of February 08, 2020 10:22 AM. If you have any questions, ask your nurse or doctor.        CALCIUM 1200 PO Take by mouth daily.   magnesium 30 MG tablet Take 30 mg by mouth daily.        Objective:   BP 100/66   Pulse 85   Temp 98.3 F (36.8 C)   Ht 5' 7" (1.702 m)   Wt 132 lb 4 oz (60 kg)   SpO2 97%   BMI 20.71 kg/m   Wt Readings from Last 3 Encounters:  02/08/20 132 lb 4 oz (60 kg)  11/13/18 136 lb (61.7 kg)  10/30/18 136 lb 12.8 oz (62.1 kg)    Physical Exam Vitals and nursing note reviewed.  Constitutional:      General: She is not in acute distress.    Appearance: She is well-developed. She is not diaphoretic.  Eyes:     Conjunctiva/sclera: Conjunctivae normal.  Cardiovascular:     Rate and Rhythm: Normal rate and regular rhythm.     Heart sounds: Normal heart sounds. No murmur heard.   Pulmonary:     Effort: Pulmonary effort is normal. No respiratory distress.     Breath sounds: Normal breath sounds. No wheezing.  Musculoskeletal:        General: No tenderness. Normal range of motion.  Skin:    General: Skin is  warm and dry.     Findings: No rash.  Neurological:     Mental Status: She is alert and oriented to person, place, and time.     Coordination: Coordination normal.  Psychiatric:        Behavior: Behavior normal.       Assessment & Plan:   Problem List Items Addressed This Visit      Musculoskeletal and Integument   Osteoporosis   Relevant Medications   Calcium Carbonate-Vit D-Min (CALCIUM 1200 PO)   Other Relevant Orders   CBC with Differential/Platelet   CMP14+EGFR   Lipid panel   TSH     Other   Well adult exam   Relevant Orders   CBC with Differential/Platelet   CMP14+EGFR   Lipid panel   TSH    Other Visit Diagnoses    Flu vaccine need    -  Primary   Relevant Orders   Flu Vaccine QUAD High Dose(Fluad)   Need for  Tdap vaccination       Relevant Orders   Tdap vaccine greater than or equal to 7yo IM   Lipid screening       Relevant Orders   Lipid panel   Diabetes mellitus screening       Relevant Orders   CMP14+EGFR      Continue current medication, will follow up in 1 year.  Seems to be doing well, do blood work today. Follow up plan: Return in about 1 year (around 02/07/2021), or if symptoms worsen or fail to improve.  Counseling provided for all of the vaccine components Orders Placed This Encounter  Procedures  . Flu Vaccine QUAD High Dose(Fluad)  . Tdap vaccine greater than or equal to 7yo IM  . CBC with Differential/Platelet  . CMP14+EGFR  . Lipid panel  . TSH    Joshua Dettinger, MD [facility] 02/08/2020, 10:22 AM     

## 2020-02-09 LAB — CBC WITH DIFFERENTIAL/PLATELET
Basophils Absolute: 0.1 10*3/uL (ref 0.0–0.2)
Basos: 1 %
EOS (ABSOLUTE): 0.1 10*3/uL (ref 0.0–0.4)
Eos: 1 %
Hematocrit: 44.9 % (ref 34.0–46.6)
Hemoglobin: 15.4 g/dL (ref 11.1–15.9)
Immature Grans (Abs): 0 10*3/uL (ref 0.0–0.1)
Immature Granulocytes: 0 %
Lymphocytes Absolute: 3.2 10*3/uL — ABNORMAL HIGH (ref 0.7–3.1)
Lymphs: 44 %
MCH: 29.7 pg (ref 26.6–33.0)
MCHC: 34.3 g/dL (ref 31.5–35.7)
MCV: 87 fL (ref 79–97)
Monocytes Absolute: 0.5 10*3/uL (ref 0.1–0.9)
Monocytes: 6 %
Neutrophils Absolute: 3.5 10*3/uL (ref 1.4–7.0)
Neutrophils: 48 %
Platelets: 275 10*3/uL (ref 150–450)
RBC: 5.18 x10E6/uL (ref 3.77–5.28)
RDW: 13 % (ref 11.7–15.4)
WBC: 7.3 10*3/uL (ref 3.4–10.8)

## 2020-02-09 LAB — CMP14+EGFR
ALT: 25 IU/L (ref 0–32)
AST: 25 IU/L (ref 0–40)
Albumin/Globulin Ratio: 1.9 (ref 1.2–2.2)
Albumin: 4.5 g/dL (ref 3.8–4.8)
Alkaline Phosphatase: 84 IU/L (ref 44–121)
BUN/Creatinine Ratio: 21 (ref 12–28)
BUN: 14 mg/dL (ref 8–27)
Bilirubin Total: 0.9 mg/dL (ref 0.0–1.2)
CO2: 25 mmol/L (ref 20–29)
Calcium: 10.4 mg/dL — ABNORMAL HIGH (ref 8.7–10.3)
Chloride: 103 mmol/L (ref 96–106)
Creatinine, Ser: 0.67 mg/dL (ref 0.57–1.00)
GFR calc Af Amer: 106 mL/min/{1.73_m2} (ref >59)
GFR calc non Af Amer: 92 mL/min/{1.73_m2} (ref >59)
Globulin, Total: 2.4 g/dL (ref 1.5–4.5)
Glucose: 117 mg/dL — ABNORMAL HIGH (ref 65–99)
Potassium: 3.8 mmol/L (ref 3.5–5.2)
Sodium: 142 mmol/L (ref 134–144)
Total Protein: 6.9 g/dL (ref 6.0–8.5)

## 2020-02-09 LAB — LIPID PANEL
Chol/HDL Ratio: 3.3 ratio (ref 0.0–4.4)
Cholesterol, Total: 156 mg/dL (ref 100–199)
HDL: 47 mg/dL (ref >39)
LDL Chol Calc (NIH): 93 mg/dL (ref 0–99)
Triglycerides: 86 mg/dL (ref 0–149)
VLDL Cholesterol Cal: 16 mg/dL (ref 5–40)

## 2020-02-09 LAB — TSH: TSH: 1.83 u[IU]/mL (ref 0.450–4.500)

## 2020-03-21 ENCOUNTER — Encounter: Payer: Self-pay | Admitting: Family Medicine

## 2020-03-21 DIAGNOSIS — R7301 Impaired fasting glucose: Secondary | ICD-10-CM

## 2020-04-01 ENCOUNTER — Other Ambulatory Visit: Payer: Medicare Other

## 2020-04-01 ENCOUNTER — Other Ambulatory Visit: Payer: Self-pay

## 2020-04-01 DIAGNOSIS — Z23 Encounter for immunization: Secondary | ICD-10-CM | POA: Diagnosis not present

## 2020-04-01 DIAGNOSIS — R7301 Impaired fasting glucose: Secondary | ICD-10-CM

## 2020-04-01 LAB — BAYER DCA HB A1C WAIVED: HB A1C (BAYER DCA - WAIVED): 5.2 % (ref <7.0)

## 2020-12-02 ENCOUNTER — Other Ambulatory Visit: Payer: Self-pay | Admitting: Family Medicine

## 2020-12-02 DIAGNOSIS — Z1231 Encounter for screening mammogram for malignant neoplasm of breast: Secondary | ICD-10-CM

## 2021-01-24 ENCOUNTER — Telehealth: Payer: Self-pay | Admitting: Family Medicine

## 2021-01-24 NOTE — Telephone Encounter (Signed)
Spoke to patient to schedule Medicare Annual Wellness Visit (AWV) either virtually or in office.  Patient stated she is not interested   Last AWV 11/02/19  please schedule at anytime with health coach  This should be a 45 minute visit.

## 2021-02-08 ENCOUNTER — Ambulatory Visit: Payer: Medicare Other | Admitting: Family Medicine

## 2021-06-12 ENCOUNTER — Ambulatory Visit
Admission: RE | Admit: 2021-06-12 | Discharge: 2021-06-12 | Disposition: A | Discharge status: Home / Self Care | Payer: Medicare Other | Source: Ambulatory Visit | Attending: Family Medicine | Admitting: Family Medicine

## 2021-06-12 DIAGNOSIS — Z1231 Encounter for screening mammogram for malignant neoplasm of breast: Secondary | ICD-10-CM | POA: Diagnosis not present

## 2021-06-15 ENCOUNTER — Encounter: Payer: Self-pay | Admitting: Family Medicine

## 2021-06-15 ENCOUNTER — Ambulatory Visit (INDEPENDENT_AMBULATORY_CARE_PROVIDER_SITE_OTHER): Payer: Medicare Other | Admitting: Family Medicine

## 2021-06-15 VITALS — BP 128/76 | HR 73 | Ht 67.0 in | Wt 133.0 lb

## 2021-06-15 DIAGNOSIS — Z23 Encounter for immunization: Secondary | ICD-10-CM

## 2021-06-15 DIAGNOSIS — Z1211 Encounter for screening for malignant neoplasm of colon: Secondary | ICD-10-CM | POA: Diagnosis not present

## 2021-06-15 DIAGNOSIS — Z13220 Encounter for screening for lipoid disorders: Secondary | ICD-10-CM | POA: Diagnosis not present

## 2021-06-15 DIAGNOSIS — Z0000 Encounter for general adult medical examination without abnormal findings: Secondary | ICD-10-CM | POA: Diagnosis not present

## 2021-06-15 DIAGNOSIS — Z131 Encounter for screening for diabetes mellitus: Secondary | ICD-10-CM | POA: Diagnosis not present

## 2021-06-15 DIAGNOSIS — N3941 Urge incontinence: Secondary | ICD-10-CM | POA: Diagnosis not present

## 2021-06-15 NOTE — Progress Notes (Signed)
BP 128/76    Pulse 73    Ht 5\' 7"  (1.702 m)    Wt 133 lb (60.3 kg)    SpO2 98%    BMI 20.83 kg/m    Subjective:   Patient ID: Melinda Bennett, female    DOB: 02-Nov-1955, 66 y.o.   MRN: [id number]  HPI: Melinda Bennett is a 66 y.o. female presenting on 06/15/2021 for Medical Management of Chronic Issues   HPI Urinary incont Patient's only complaint today is that she does have some urinary leakage and incontinence.  She says when she has to go she has to go and she has to make it to the restroom now and then she does get some leakage when she coughs or sneezes but more urge where she has to go when she has to go now.  She has had 4 previous vaginal deliveries and one of them was prolonged and breech.  She says that its not enough of an issue where she wants to do something about it and we discussed Kegel's and she wants to try that.  Well exam Patient denies any chest pain, shortness of breath, headaches or vision issues, abdominal complaints, diarrhea, nausea, vomiting, or joint issues.    Relevant past medical, surgical, family and social history reviewed and updated as indicated. Interim medical history since our last visit reviewed. Allergies and medications reviewed and updated.  Review of Systems  Constitutional:  Negative for chills and fever.  HENT:  Negative for ear pain and tinnitus.   Eyes:  Negative for pain and visual disturbance.  Respiratory:  Negative for cough, chest tightness, shortness of breath and wheezing.   Cardiovascular:  Negative for chest pain, palpitations and leg swelling.  Gastrointestinal:  Negative for abdominal pain, blood in stool, constipation and diarrhea.  Genitourinary:  Positive for urgency. Negative for dysuria and hematuria.  Musculoskeletal:  Negative for back pain, gait problem and myalgias.  Skin:  Negative for rash.  Neurological:  Negative for dizziness, weakness, light-headedness and headaches.  Psychiatric/Behavioral:  Negative for agitation,  behavioral problems and suicidal ideas.   All other systems reviewed and are negative.  Per HPI unless specifically indicated above   Allergies as of 06/15/2021   No Known Allergies      Medication List        Accurate as of June 15, 2021  3:05 PM. If you have any questions, ask your nurse or doctor.          CALCIUM 1200 PO Take by mouth daily.   magnesium 30 MG tablet Take 30 mg by mouth daily.         Objective:   BP 128/76    Pulse 73    Ht 5\' 7"  (1.702 m)    Wt 133 lb (60.3 kg)    SpO2 98%    BMI 20.83 kg/m   Wt Readings from Last 3 Encounters:  06/15/21 133 lb (60.3 kg)  02/08/20 132 lb 4 oz (60 kg)  11/13/18 136 lb (61.7 kg)    Physical Exam Vitals and nursing note reviewed.  Constitutional:      General: She is not in acute distress.    Appearance: Normal appearance. She is well-developed. She is not diaphoretic.  Eyes:     Conjunctiva/sclera: Conjunctivae normal.  Cardiovascular:     Rate and Rhythm: Normal rate and regular rhythm.     Heart sounds: Normal heart sounds. No murmur heard. Pulmonary:     Effort: Pulmonary effort is  normal. No respiratory distress.     Breath sounds: Normal breath sounds. No wheezing.  Abdominal:     General: Abdomen is flat. Bowel sounds are normal. There is no distension.     Palpations: Abdomen is soft.     Tenderness: There is no abdominal tenderness. There is no guarding.  Musculoskeletal:        General: No tenderness. Normal range of motion.  Skin:    General: Skin is warm and dry.     Findings: No rash.  Neurological:     Mental Status: She is alert and oriented to person, place, and time.     Coordination: Coordination normal.  Psychiatric:        Behavior: Behavior normal.      Assessment & Plan:   Problem List Items Addressed This Visit       Other   Well adult exam   Relevant Orders   CBC with Differential/Platelet   Other Visit Diagnoses     Urge incontinence of urine    -  Primary    Relevant Orders   CBC with Differential/Platelet   Need for pneumococcal vaccination       Relevant Orders   Pneumococcal conjugate vaccine 20-valent (Prevnar 20) (Completed)   Lipid screening       Relevant Orders   Lipid panel   Diabetes mellitus screening       Relevant Orders   CMP14+EGFR   Colon cancer screening       Relevant Orders   Cologuard       Will check blood work today, recommended Kegel's for her urinary incontinence and will discuss if she needs any help further than that in the future. Follow up plan: Return in about 1 year (around 06/15/2022), or if symptoms worsen or fail to improve, for Physical.  Counseling provided for all of the vaccine components Orders Placed This Encounter  Procedures   Pneumococcal conjugate vaccine 20-valent (Prevnar 20)   CBC with Differential/Platelet   CMP14+EGFR   Lipid panel   Cologuard    Caryl Pina, MD [facility] 06/15/2021, 3:05 PM

## 2021-06-16 LAB — CMP14+EGFR
ALT: 17 IU/L (ref 0–32)
AST: 21 IU/L (ref 0–40)
Albumin/Globulin Ratio: 1.9 (ref 1.2–2.2)
Albumin: 4.4 g/dL (ref 3.8–4.8)
Alkaline Phosphatase: 119 IU/L (ref 44–121)
BUN/Creatinine Ratio: 21 (ref 12–28)
BUN: 15 mg/dL (ref 8–27)
Bilirubin Total: 0.3 mg/dL (ref 0.0–1.2)
CO2: 27 mmol/L (ref 20–29)
Calcium: 10.7 mg/dL — ABNORMAL HIGH (ref 8.7–10.3)
Chloride: 101 mmol/L (ref 96–106)
Creatinine, Ser: 0.71 mg/dL (ref 0.57–1.00)
Globulin, Total: 2.3 g/dL (ref 1.5–4.5)
Glucose: 89 mg/dL (ref 70–99)
Potassium: 4.5 mmol/L (ref 3.5–5.2)
Sodium: 140 mmol/L (ref 134–144)
Total Protein: 6.7 g/dL (ref 6.0–8.5)
eGFR: 93 mL/min/{1.73_m2} (ref >59)

## 2021-06-16 LAB — LIPID PANEL
Chol/HDL Ratio: 2.7 ratio (ref 0.0–4.4)
Cholesterol, Total: 222 mg/dL — ABNORMAL HIGH (ref 100–199)
HDL: 81 mg/dL (ref >39)
LDL Chol Calc (NIH): 129 mg/dL — ABNORMAL HIGH (ref 0–99)
Triglycerides: 69 mg/dL (ref 0–149)
VLDL Cholesterol Cal: 12 mg/dL (ref 5–40)

## 2021-06-16 LAB — CBC WITH DIFFERENTIAL/PLATELET
Basophils Absolute: 0.1 10*3/uL (ref 0.0–0.2)
Basos: 1 %
EOS (ABSOLUTE): 0.1 10*3/uL (ref 0.0–0.4)
Eos: 1 %
Hematocrit: 41.8 % (ref 34.0–46.6)
Hemoglobin: 13.4 g/dL (ref 11.1–15.9)
Immature Grans (Abs): 0 10*3/uL (ref 0.0–0.1)
Immature Granulocytes: 1 %
Lymphocytes Absolute: 1.9 10*3/uL (ref 0.7–3.1)
Lymphs: 28 %
MCH: 27.7 pg (ref 26.6–33.0)
MCHC: 32.1 g/dL (ref 31.5–35.7)
MCV: 87 fL (ref 79–97)
Monocytes Absolute: 0.5 10*3/uL (ref 0.1–0.9)
Monocytes: 7 %
Neutrophils Absolute: 4.3 10*3/uL (ref 1.4–7.0)
Neutrophils: 62 %
Platelets: 266 10*3/uL (ref 150–450)
RBC: 4.83 x10E6/uL (ref 3.77–5.28)
RDW: 12.3 % (ref 11.7–15.4)
WBC: 6.9 10*3/uL (ref 3.4–10.8)

## 2021-08-02 DIAGNOSIS — U071 COVID-19: Secondary | ICD-10-CM | POA: Diagnosis not present

## 2021-08-08 ENCOUNTER — Telehealth: Payer: Self-pay | Admitting: Family Medicine

## 2021-08-09 NOTE — Telephone Encounter (Signed)
Appt made

## 2021-08-10 ENCOUNTER — Other Ambulatory Visit: Payer: Self-pay

## 2021-08-10 ENCOUNTER — Ambulatory Visit (INDEPENDENT_AMBULATORY_CARE_PROVIDER_SITE_OTHER): Payer: Medicare Other

## 2021-08-10 DIAGNOSIS — Z780 Asymptomatic menopausal state: Secondary | ICD-10-CM | POA: Diagnosis not present

## 2021-08-10 DIAGNOSIS — M810 Age-related osteoporosis without current pathological fracture: Secondary | ICD-10-CM | POA: Diagnosis not present

## 2021-08-15 DIAGNOSIS — H25013 Cortical age-related cataract, bilateral: Secondary | ICD-10-CM | POA: Diagnosis not present

## 2021-08-15 DIAGNOSIS — H18413 Arcus senilis, bilateral: Secondary | ICD-10-CM | POA: Diagnosis not present

## 2021-08-15 DIAGNOSIS — H25043 Posterior subcapsular polar age-related cataract, bilateral: Secondary | ICD-10-CM | POA: Diagnosis not present

## 2021-08-15 DIAGNOSIS — H2512 Age-related nuclear cataract, left eye: Secondary | ICD-10-CM | POA: Diagnosis not present

## 2021-08-15 DIAGNOSIS — H2513 Age-related nuclear cataract, bilateral: Secondary | ICD-10-CM | POA: Diagnosis not present

## 2021-08-29 ENCOUNTER — Ambulatory Visit (INDEPENDENT_AMBULATORY_CARE_PROVIDER_SITE_OTHER): Payer: Medicare Other | Admitting: Pharmacist

## 2021-08-29 DIAGNOSIS — M810 Age-related osteoporosis without current pathological fracture: Secondary | ICD-10-CM

## 2021-08-29 NOTE — Progress Notes (Signed)
? ? ?  08/29/2021 ?Name: Melinda Bennett MRN: [id number] DOB: 1955-07-09 ? ? ?S:  25 yoF presents for osteoporosis evaluation, education, and management.  She is a retired Marine scientist and is interested in discussing osteoporosis pharmacotherapy and goals.  She reports she is physically active, which is important to her bone health and overall health.  She is of normal body weight/habitus.  PCP has discussed the importance of starting calcium and vitamin D as well as starting bisphosphonate therapy. ? ?Current Height:   5'7"     Max Lifetime Height:  5'7" ?Current Weight:    133lbs    ? ?Ethnicity:Caucasian  ? ?HPI: ?Does pt already have a diagnosis of:  ?Osteopenia?  Yes ?Osteoporosis?  Yes ? ?Back Pain?  No       Kyphosis?  No ?Prior fracture?  No ?Med(s) for Osteoporosis/Osteopenia:  calcium/vitamin D ?Med(s) previously tried for Osteoporosis/Osteopenia:  n/a; was prescribed fosamax, but never took ?                                                            ?PMH: ?HRT? No ?Steroid Use?  No ?Thyroid med?  No ?History of cancer?  No ?History of digestive disorders (ie Crohn's)?  No ?Current or previous eating disorders?  No ?Last Vitamin D Result:  need to order at next PCP f/u ?Last GFR Result:  93 on 06/15/21 ?  ?FH/SH: ?Family history of osteoporosis? N/a ?Parent with history of hip fracture?  No ?Family history of breast cancer?  No ?Exercise?  Yes active ?Smoking?  No ?Alcohol?  No ?  ? ? ?DEXA Results 2023 ?Assessment: ?FINDINGS: ?AP LUMBAR SPINE L1-L4 ? Bone Mineral Density (BMD):  0.686 g/cm2 ? Young Adult T-Score:  -4.1 ?  Z-Score:  -2.3 ?  ?Left FEMUR neck ? Bone Mineral Density (BMD):  0.615 g/cm2 ? Young Adult T-Score: -3.0 ? Z-Score:  -1.3 ? ?DEXA from 2020 ?AP Spine Bone Density ?Densitometry: [place] (Combined NHANES/Lunar) ?                 T-score Z-score ?L1 0.647   -4.0  -2.3 ?L2 0.625   -4.8  -3.1 ?L3 0.810   -3.3  -1.6 ?L4 0.698   -4.0  -2.4 ?L1-L2 0.635   -4.4 -2.8 ?L1-L4 (L3) 0.661  -4.3  -2.6 ?L2-L4 (L3) 0.666   -4.4  -2.7 ? ?   ?Scan quality: The scan quality is good. Exclusions: None. ?  ?ASSESSMENT: Patient's diagnostic category is OSTEOPOROSIS by WHO Criteria. ?  ?FRACTURE RISK: INCREASED. ?  ?FRAX SCORE:  Not reported due to T-score at or below -2.5. ?  ?Recommendations: ?1.  Start  alendronate (FOSAMAX) '70mg'$  weekly.  Patient has supply at home, so she is going to try and see if she is able to tolerate.  ?2.  continue calcium '1200mg'$  daily through supplementation or diet.  500 IU ?3.  continue weight bearing exercise - 30 minutes at least 4 days per week.   ?4.  Counseled and educated about fall risk and prevention. ? ?Recheck DEXA:  2 years ? ?Time spent counseling patient:  20 minutes ? ?Regina Eck, PharmD, BCPS ?Clinical Pharmacist, [facility] ?[place]  [place] Phone [phone] ? ? ? ? ? ? ? ? ?

## 2021-11-13 ENCOUNTER — Other Ambulatory Visit: Payer: Self-pay | Admitting: Family Medicine

## 2021-11-13 MED ORDER — ALENDRONATE SODIUM 70 MG PO TABS: 70.0000 mg | ORAL_TABLET | ORAL | 2 refills | Status: DC

## 2021-11-13 NOTE — Telephone Encounter (Signed)
  Prescription Request  11/13/2021  Is this a "Controlled Substance" medicine? No   Have you seen your PCP in the last 2 weeks? no  If YES, route message to pool  -  If NO, patient needs to be scheduled for appointment.  What is the name of the medication or equipment? Alendronate 70 mg  Have you contacted your pharmacy to request a refill? no   Which pharmacy would you like this sent to? [facility]   Patient notified that their request is being sent to the clinical staff for review and that they should receive a response within 2 business days.

## 2021-11-13 NOTE — Telephone Encounter (Signed)
Last office visit 06/15/21 with instructions to return in one year.  Med is on med list but never prescribed from our office.  Please advise.

## 2021-11-27 DIAGNOSIS — H2512 Age-related nuclear cataract, left eye: Secondary | ICD-10-CM | POA: Diagnosis not present

## 2021-11-27 DIAGNOSIS — Z1211 Encounter for screening for malignant neoplasm of colon: Secondary | ICD-10-CM | POA: Diagnosis not present

## 2021-11-28 DIAGNOSIS — H2511 Age-related nuclear cataract, right eye: Secondary | ICD-10-CM | POA: Diagnosis not present

## 2021-12-03 LAB — COLOGUARD: COLOGUARD: NEGATIVE

## 2021-12-04 DIAGNOSIS — H2512 Age-related nuclear cataract, left eye: Secondary | ICD-10-CM | POA: Diagnosis not present

## 2021-12-11 DIAGNOSIS — H2511 Age-related nuclear cataract, right eye: Secondary | ICD-10-CM | POA: Diagnosis not present

## 2021-12-18 DIAGNOSIS — H2511 Age-related nuclear cataract, right eye: Secondary | ICD-10-CM | POA: Diagnosis not present

## 2022-01-18 ENCOUNTER — Ambulatory Visit (INDEPENDENT_AMBULATORY_CARE_PROVIDER_SITE_OTHER): Payer: Medicare Other | Admitting: Family Medicine

## 2022-01-18 ENCOUNTER — Encounter: Payer: Self-pay | Admitting: Family Medicine

## 2022-01-18 VITALS — BP 122/74 | HR 94 | Temp 97.7°F | Ht 67.0 in | Wt 134.6 lb

## 2022-01-18 DIAGNOSIS — J40 Bronchitis, not specified as acute or chronic: Secondary | ICD-10-CM | POA: Diagnosis not present

## 2022-01-18 DIAGNOSIS — J329 Chronic sinusitis, unspecified: Secondary | ICD-10-CM | POA: Diagnosis not present

## 2022-01-18 DIAGNOSIS — W57XXXA Bitten or stung by nonvenomous insect and other nonvenomous arthropods, initial encounter: Secondary | ICD-10-CM | POA: Diagnosis not present

## 2022-01-18 MED ORDER — PREDNISONE 10 MG PO TABS: ORAL_TABLET | ORAL | 0 refills | Status: DC

## 2022-01-18 MED ORDER — DOXYCYCLINE HYCLATE 100 MG PO CAPS: 100.0000 mg | ORAL_CAPSULE | Freq: Two times a day (BID) | ORAL | 0 refills | Status: DC

## 2022-01-18 NOTE — Progress Notes (Signed)
Chief Complaint  Patient presents with   Cough   Fatigue    HPI  Patient presents today for Patient presents with upper respiratory congestion. Started with [facility] for about a week. Resp. Sx started August 15 with fever, coughand fatigue. Dry cough with tightness in upper chest. except occasionally disgusting green color phlegm. Rhinorrhea that is frequently purulent. There is no sore throat. Patient reports coughing frequently as well.  Scant sputum noted. There is no fever, chills or sweats. The patient denies being short of breath. Onset was 3-5 weeks ago. Gradually worsening. Tried OTCs without improvement. Bit by tick 3 days ago. Deer tick. Got it off the same day. Right upper thigh. Also something bit her LLE in the garden the same day that caused whelps that went away overnight.  PMH: Smoking status noted ROS: Per HPI  Objective: BP 122/74   Pulse 94   Temp 97.7 F (36.5 C)   Ht '5\' 7"'$  (1.702 m)   Wt 134 lb 10.1 oz (61.1 kg)   SpO2 97%   BMI 21.09 kg/m  Gen: NAD, alert, cooperative with exam HEENT: NCAT, Nasal passages swollen ,dull and boggy. TMs clear. CV: RRR, good S1/S2, no murmur Resp: Bronchitis changes with scattered wheezes, non-labored Ext: No edema, warm Neuro: Alert and oriented, No gross deficits  Assessment and plan:  1. Sinobronchitis   2. Tick bite, initial encounter     Meds ordered this encounter  Medications   predniSONE (DELTASONE) 10 MG tablet    Sig: Take 5 daily for 2 days followed by 4,3,2 and 1 for 2 days each.    Dispense:  30 tablet    Refill:  0   doxycycline (VIBRAMYCIN) 100 MG capsule    Sig: Take 1 capsule (100 mg total) by mouth 2 (two) times daily.    Dispense:  20 capsule    Refill:  0    No orders of the defined types were placed in this encounter.   Follow up as needed.  Claretta Fraise, MD

## 2022-02-08 ENCOUNTER — Telehealth (INDEPENDENT_AMBULATORY_CARE_PROVIDER_SITE_OTHER): Payer: Medicare Other | Admitting: Family Medicine

## 2022-02-08 ENCOUNTER — Encounter: Payer: Self-pay | Admitting: Family Medicine

## 2022-02-08 DIAGNOSIS — L02512 Cutaneous abscess of left hand: Secondary | ICD-10-CM | POA: Diagnosis not present

## 2022-02-08 MED ORDER — VALACYCLOVIR HCL 1 G PO TABS: 1000.0000 mg | ORAL_TABLET | Freq: Two times a day (BID) | ORAL | 0 refills | Status: AC

## 2022-02-08 NOTE — Progress Notes (Signed)
Virtual Visit via mychart video Note  I connected with Melinda Bennett on 02/08/22 at 1644 by video and verified that I am speaking with the correct person using two identifiers. Melinda Bennett is currently located at home and patient are currently with her during visit. The provider, Fransisca Kaufmann Bijon Mineer, MD is located in their office at time of visit.  Call ended at 1650  I discussed the limitations, risks, security and privacy concerns of performing an evaluation and management service by video and the availability of in person appointments. I also discussed with the patient that there may be a patient responsible charge related to this service. The patient expressed understanding and agreed to proceed.   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  She has carried it but has only had it a few times in her life.  She denies any rash anywhere else. She has a burning sensation. She did finish prednisone a week ago and the burning sensation.  She does not have rash yet.    1. Whitlow, left     Outpatient Encounter Medications as of 02/08/2022  Medication Sig   valACYclovir (VALTREX) 1000 MG tablet Take 1 tablet (1,000 mg total) by mouth 2 (two) times daily for 10 days.   alendronate (FOSAMAX) 70 MG tablet Take 1 tablet (70 mg total) by mouth once a week. Take with a full glass of water on an empty stomach.   Calcium Carbonate-Vit D-Min (CALCIUM 1200 PO) Take by mouth daily.   doxycycline (VIBRAMYCIN) 100 MG capsule Take 1 capsule (100 mg total) by mouth 2 (two) times daily.   magnesium 30 MG tablet Take 30 mg by mouth daily.   predniSONE (DELTASONE) 10 MG tablet Take 5 daily for 2 days followed by 4,3,2 and 1 for 2 days each.   No facility-administered encounter medications on file as of 02/08/2022.    Review of Systems  Constitutional:  Negative for chills and fever.  Eyes:  Negative for visual disturbance.  Respiratory:   Negative for chest tightness and shortness of breath.   Cardiovascular:  Negative for chest pain and leg swelling.  Genitourinary:  Negative for difficulty urinating and dysuria.  Musculoskeletal:  Negative for back pain and gait problem.  Skin:  Negative for color change and rash.  Neurological:  Positive for numbness. Negative for weakness, light-headedness and headaches.  Psychiatric/Behavioral:  Negative for agitation and behavioral problems.   All other systems reviewed and are negative.   Observations/Objective: Patient sounds comfortable and in no acute distress  Assessment and Plan: Problem List Items Addressed This Visit   None Visit Diagnoses     Whitlow, left    -  Primary   Relevant Medications   valACYclovir (VALTREX) 1000 MG tablet       We will do Valtrex for her that she can take, recommended at least 2 to 3 days of it and then if not improved can be more. Follow up plan: Return if symptoms worsen or fail to improve.     I discussed the assessment and treatment plan with the patient. The patient was provided an opportunity to ask questions and all were answered. The patient agreed with the plan and demonstrated an understanding of the instructions.   The patient was advised to call back or seek an in-person evaluation if the symptoms worsen or if the condition fails to improve as anticipated.  The above assessment and  management plan was discussed with the patient. The patient verbalized understanding of and has agreed to the management plan. Patient is aware to call the clinic if symptoms persist or worsen. Patient is aware when to return to the clinic for a follow-up visit. Patient educated on when it is appropriate to go to the emergency department.    I provided 6 minutes of non-face-to-face time during this encounter.    Worthy Rancher, MD

## 2022-03-12 DIAGNOSIS — H26492 Other secondary cataract, left eye: Secondary | ICD-10-CM | POA: Diagnosis not present

## 2022-03-12 DIAGNOSIS — Z961 Presence of intraocular lens: Secondary | ICD-10-CM | POA: Diagnosis not present

## 2022-06-07 DIAGNOSIS — H02831 Dermatochalasis of right upper eyelid: Secondary | ICD-10-CM | POA: Diagnosis not present

## 2022-06-07 DIAGNOSIS — Z961 Presence of intraocular lens: Secondary | ICD-10-CM | POA: Diagnosis not present

## 2022-06-07 DIAGNOSIS — H18413 Arcus senilis, bilateral: Secondary | ICD-10-CM | POA: Diagnosis not present

## 2022-06-07 DIAGNOSIS — H26493 Other secondary cataract, bilateral: Secondary | ICD-10-CM | POA: Diagnosis not present

## 2022-06-07 DIAGNOSIS — H26492 Other secondary cataract, left eye: Secondary | ICD-10-CM | POA: Diagnosis not present

## 2022-06-14 DIAGNOSIS — Z961 Presence of intraocular lens: Secondary | ICD-10-CM | POA: Diagnosis not present

## 2022-06-14 DIAGNOSIS — H26492 Other secondary cataract, left eye: Secondary | ICD-10-CM | POA: Diagnosis not present

## 2022-06-18 ENCOUNTER — Ambulatory Visit (INDEPENDENT_AMBULATORY_CARE_PROVIDER_SITE_OTHER): Payer: Medicare Other | Admitting: Family Medicine

## 2022-06-18 ENCOUNTER — Encounter: Payer: Self-pay | Admitting: Family Medicine

## 2022-06-18 VITALS — BP 124/76 | HR 85 | Ht 67.0 in | Wt 134.0 lb

## 2022-06-18 DIAGNOSIS — Z0000 Encounter for general adult medical examination without abnormal findings: Secondary | ICD-10-CM

## 2022-06-18 DIAGNOSIS — R32 Unspecified urinary incontinence: Secondary | ICD-10-CM | POA: Insufficient documentation

## 2022-06-18 DIAGNOSIS — E785 Hyperlipidemia, unspecified: Secondary | ICD-10-CM | POA: Insufficient documentation

## 2022-06-18 DIAGNOSIS — E782 Mixed hyperlipidemia: Secondary | ICD-10-CM

## 2022-06-18 DIAGNOSIS — N3946 Mixed incontinence: Secondary | ICD-10-CM

## 2022-06-18 DIAGNOSIS — M810 Age-related osteoporosis without current pathological fracture: Secondary | ICD-10-CM

## 2022-06-18 NOTE — Progress Notes (Signed)
BP 124/76   Pulse 85   Ht '5\' 7"'$  (1.702 m)   Wt 134 lb (60.8 kg)   SpO2 99%   BMI 20.99 kg/m    Subjective:   Patient ID: Melinda Bennett, female    DOB: 16-Feb-1956, 67 y.o.   MRN: [id number]  HPI: Melinda Bennett is a 67 y.o. female presenting on 06/18/2022 for Medical Management of Chronic Issues (CPE)   HPI Physical exam Patient denies any chest pain, shortness of breath, headaches or vision issues, abdominal complaints, diarrhea, nausea, vomiting, or joint issues.  She does have some bladder incontinence and we discussed that, and she is thinking about going to do surgery for possible prolapse which we did note on the previous exam.  She is going to hold off for now but will consider it.  Osteoporosis/osteopenia Fractures or history of fracture: None Medication: Fosamax calcium and vitamin D Duration of treatment: 1.5 years Last bone density scan: 08/10/2021 Last T score: -4.1 which is actually slight improvement from previous -4.3  Relevant past medical, surgical, family and social history reviewed and updated as indicated. Interim medical history since our last visit reviewed. Allergies and medications reviewed and updated.  Review of Systems  Constitutional:  Negative for chills and fever.  HENT:  Negative for congestion, ear discharge, ear pain and tinnitus.   Eyes:  Negative for pain, redness and visual disturbance.  Respiratory:  Negative for cough, chest tightness, shortness of breath and wheezing.   Cardiovascular:  Negative for chest pain, palpitations and leg swelling.  Gastrointestinal:  Negative for abdominal pain, blood in stool, constipation and diarrhea.  Genitourinary:  Positive for urgency. Negative for difficulty urinating, dysuria and hematuria.  Musculoskeletal:  Negative for back pain, gait problem and myalgias.  Skin:  Negative for rash.  Neurological:  Negative for dizziness, weakness, light-headedness and headaches.  Psychiatric/Behavioral:  Negative for  agitation, behavioral problems and suicidal ideas.   All other systems reviewed and are negative.   Per HPI unless specifically indicated above   Allergies as of 06/18/2022   No Known Allergies      Medication List        Accurate as of June 18, 2022  9:44 AM. If you have any questions, ask your nurse or doctor.          STOP taking these medications    doxycycline 100 MG capsule Commonly known as: Vibramycin Stopped by: Fransisca Kaufmann Khyren Hing, MD   predniSONE 10 MG tablet Commonly known as: DELTASONE Stopped by: Fransisca Kaufmann Jerline Linzy, MD       TAKE these medications    alendronate 70 MG tablet Commonly known as: FOSAMAX Take 1 tablet (70 mg total) by mouth once a week. Take with a full glass of water on an empty stomach.   CALCIUM 1200 PO Take by mouth daily.   magnesium 30 MG tablet Take 30 mg by mouth daily.   zinc gluconate 50 MG tablet Take 50 mg by mouth daily.         Objective:   BP 124/76   Pulse 85   Ht '5\' 7"'$  (1.702 m)   Wt 134 lb (60.8 kg)   SpO2 99%   BMI 20.99 kg/m   Wt Readings from Last 3 Encounters:  06/18/22 134 lb (60.8 kg)  01/18/22 134 lb 10.1 oz (61.1 kg)  06/15/21 133 lb (60.3 kg)    Physical Exam Vitals and nursing note reviewed.  Constitutional:      General: She is  not in acute distress.    Appearance: Normal appearance. She is well-developed. She is not diaphoretic.  HENT:     Right Ear: Tympanic membrane normal.     Left Ear: Tympanic membrane normal.     Mouth/Throat:     Mouth: Mucous membranes are moist.     Pharynx: Oropharynx is clear. No oropharyngeal exudate or posterior oropharyngeal erythema.  Eyes:     Conjunctiva/sclera: Conjunctivae normal.  Cardiovascular:     Rate and Rhythm: Normal rate and regular rhythm.     Heart sounds: Normal heart sounds. No murmur heard. Pulmonary:     Effort: Pulmonary effort is normal. No respiratory distress.     Breath sounds: Normal breath sounds. No wheezing.   Abdominal:     General: Abdomen is flat. Bowel sounds are normal. There is no distension.     Tenderness: There is no abdominal tenderness. There is no guarding or rebound.  Musculoskeletal:        General: No swelling or tenderness. Normal range of motion.  Skin:    General: Skin is warm and dry.     Findings: No rash.  Neurological:     Mental Status: She is alert and oriented to person, place, and time.     Coordination: Coordination normal.  Psychiatric:        Behavior: Behavior normal.       Assessment & Plan:   Problem List Items Addressed This Visit       Musculoskeletal and Integument   Osteoporosis   Relevant Orders   CBC with Differential/Platelet   CMP14+EGFR   Lipid panel   VITAMIN D 25 Hydroxy (Vit-D Deficiency, Fractures)     Other   Hyperlipidemia   Relevant Orders   CBC with Differential/Platelet   CMP14+EGFR   Lipid panel   Bladder incontinence   Well adult exam - Primary   Relevant Orders   CBC with Differential/Platelet   CMP14+EGFR   Lipid panel    Patient wants to hold off on do anything about the bladder incontinence but will consider surgeon, likely from the prolapse.  Trying diet for the cholesterol. Follow up plan: Return in about 1 year (around 06/19/2023), or if symptoms worsen or fail to improve, for Physical exam.  Counseling provided for all of the vaccine components Orders Placed This Encounter  Procedures   CBC with Differential/Platelet   CMP14+EGFR   Lipid panel   VITAMIN D 25 Hydroxy (Vit-D Deficiency, Fractures)    Caryl Pina, MD [facility] 06/18/2022, 9:44 AM

## 2022-06-19 LAB — CBC WITH DIFFERENTIAL/PLATELET
Basophils Absolute: 0 10*3/uL (ref 0.0–0.2)
Basos: 1 %
EOS (ABSOLUTE): 0.1 10*3/uL (ref 0.0–0.4)
Eos: 1 %
Hematocrit: 40.5 % (ref 34.0–46.6)
Hemoglobin: 13.1 g/dL (ref 11.1–15.9)
Immature Grans (Abs): 0 10*3/uL (ref 0.0–0.1)
Immature Granulocytes: 0 %
Lymphocytes Absolute: 1.5 10*3/uL (ref 0.7–3.1)
Lymphs: 30 %
MCH: 28.4 pg (ref 26.6–33.0)
MCHC: 32.3 g/dL (ref 31.5–35.7)
MCV: 88 fL (ref 79–97)
Monocytes Absolute: 0.4 10*3/uL (ref 0.1–0.9)
Monocytes: 7 %
Neutrophils Absolute: 3.1 10*3/uL (ref 1.4–7.0)
Neutrophils: 61 %
Platelets: 261 10*3/uL (ref 150–450)
RBC: 4.62 x10E6/uL (ref 3.77–5.28)
RDW: 12.3 % (ref 11.7–15.4)
WBC: 5.1 10*3/uL (ref 3.4–10.8)

## 2022-06-19 LAB — LIPID PANEL
Chol/HDL Ratio: 3.1 ratio (ref 0.0–4.4)
Cholesterol, Total: 245 mg/dL — ABNORMAL HIGH (ref 100–199)
HDL: 79 mg/dL (ref >39)
LDL Chol Calc (NIH): 149 mg/dL — ABNORMAL HIGH (ref 0–99)
Triglycerides: 98 mg/dL (ref 0–149)
VLDL Cholesterol Cal: 17 mg/dL (ref 5–40)

## 2022-06-19 LAB — CMP14+EGFR
ALT: 19 IU/L (ref 0–32)
AST: 18 IU/L (ref 0–40)
Albumin/Globulin Ratio: 2.1 (ref 1.2–2.2)
Albumin: 4.5 g/dL (ref 3.9–4.9)
Alkaline Phosphatase: 62 IU/L (ref 44–121)
BUN/Creatinine Ratio: 18 (ref 12–28)
BUN: 13 mg/dL (ref 8–27)
Bilirubin Total: 0.4 mg/dL (ref 0.0–1.2)
CO2: 26 mmol/L (ref 20–29)
Calcium: 10.6 mg/dL — ABNORMAL HIGH (ref 8.7–10.3)
Chloride: 103 mmol/L (ref 96–106)
Creatinine, Ser: 0.71 mg/dL (ref 0.57–1.00)
Globulin, Total: 2.1 g/dL (ref 1.5–4.5)
Glucose: 87 mg/dL (ref 70–99)
Potassium: 4.7 mmol/L (ref 3.5–5.2)
Sodium: 141 mmol/L (ref 134–144)
Total Protein: 6.6 g/dL (ref 6.0–8.5)
eGFR: 93 mL/min/{1.73_m2} (ref >59)

## 2022-06-19 LAB — VITAMIN D 25 HYDROXY (VIT D DEFICIENCY, FRACTURES): Vit D, 25-Hydroxy: 19.7 ng/mL — ABNORMAL LOW (ref 30.0–100.0)

## 2022-06-26 DIAGNOSIS — H26491 Other secondary cataract, right eye: Secondary | ICD-10-CM | POA: Diagnosis not present

## 2022-07-03 DIAGNOSIS — Z961 Presence of intraocular lens: Secondary | ICD-10-CM | POA: Diagnosis not present

## 2022-07-03 DIAGNOSIS — H26491 Other secondary cataract, right eye: Secondary | ICD-10-CM | POA: Diagnosis not present

## 2022-08-07 ENCOUNTER — Other Ambulatory Visit: Payer: Self-pay | Admitting: Family Medicine

## 2022-09-03 ENCOUNTER — Other Ambulatory Visit: Payer: Self-pay | Admitting: Family Medicine

## 2022-09-03 ENCOUNTER — Encounter: Payer: Self-pay | Admitting: Family Medicine

## 2022-09-03 MED ORDER — ROSUVASTATIN CALCIUM 5 MG PO TABS
ORAL_TABLET | ORAL | 3 refills | Status: DC
Start: 2022-09-03 — End: 2023-06-20

## 2022-09-03 NOTE — Progress Notes (Signed)
Sent prescription for Crestor

## 2022-09-27 ENCOUNTER — Ambulatory Visit (INDEPENDENT_AMBULATORY_CARE_PROVIDER_SITE_OTHER): Payer: Medicare Other | Admitting: Family Medicine

## 2022-09-27 ENCOUNTER — Encounter: Payer: Self-pay | Admitting: Family Medicine

## 2022-09-27 VITALS — BP 104/57 | HR 70 | Temp 97.5°F | Ht 67.0 in | Wt 133.0 lb

## 2022-09-27 DIAGNOSIS — W57XXXA Bitten or stung by nonvenomous insect and other nonvenomous arthropods, initial encounter: Secondary | ICD-10-CM | POA: Diagnosis not present

## 2022-09-27 DIAGNOSIS — M545 Low back pain, unspecified: Secondary | ICD-10-CM | POA: Diagnosis not present

## 2022-09-27 MED ORDER — MELOXICAM 7.5 MG PO TABS
7.5000 mg | ORAL_TABLET | Freq: Every day | ORAL | 0 refills | Status: DC
Start: 2022-09-27 — End: 2022-10-24

## 2022-09-27 MED ORDER — CYCLOBENZAPRINE HCL 5 MG PO TABS
5.0000 mg | ORAL_TABLET | Freq: Three times a day (TID) | ORAL | 1 refills | Status: DC | PRN
Start: 2022-09-27 — End: 2023-03-18

## 2022-09-27 NOTE — Progress Notes (Signed)
Acute Office Visit  Subjective:     Patient ID: Melinda Bennett, female    DOB: 04/03/1956, 67 y.o.   MRN: [id number]  Chief Complaint  Patient presents with   Back Pain   Tick Removal    Back Pain This is a new problem. Episode onset: 10 days ago. The problem occurs intermittently. The problem has been gradually improving since onset. The quality of the pain is described as aching and cramping. Radiates to: left buttocks. The symptoms are aggravated by bending, twisting, position and standing. Stiffness is present: after sitting for awhile. Pertinent negatives include no bladder incontinence, bowel incontinence, chest pain, dysuria, fever, leg pain, numbness, paresis, paresthesias, pelvic pain, perianal numbness, tingling or weakness. She has tried NSAIDs and ice (TENS) for the symptoms. The treatment provided moderate relief.  Back started after lifting an object in an awkward way. Her husband recently had back surgery so that has had to be more active.   She also reports multiple tick bites last month. They were deer ticks. She had one on her back on around 08/27/22. This bite become red and inflamed. No rash. There was a hard nodule with redness and tenderness. She has been feeling fatigued recently, denies other symptoms of tick borne illness. Bite has now resolved.   Review of Systems  Constitutional:  Negative for fever.  Cardiovascular:  Negative for chest pain.  Gastrointestinal:  Negative for bowel incontinence.  Genitourinary:  Negative for bladder incontinence, dysuria and pelvic pain.  Musculoskeletal:  Positive for back pain.  Neurological:  Negative for tingling, weakness, numbness and paresthesias.        Objective:    BP (!) 104/57   Pulse 70   Temp (!) 97.5 F (36.4 C) (Temporal)   Ht 5\' 7"  (1.702 m)   Wt 133 lb (60.3 kg)   SpO2 95%   BMI 20.83 kg/m    Physical Exam Vitals and nursing note reviewed.  Constitutional:      General: She is not in acute  distress.    Appearance: Normal appearance. She is not ill-appearing, toxic-appearing or diaphoretic.  HENT:     Head: Normocephalic and atraumatic.     Nose: Nose normal.     Mouth/Throat:     Mouth: Mucous membranes are moist.     Pharynx: Oropharynx is clear.  Pulmonary:     Effort: Pulmonary effort is normal. No respiratory distress.  Musculoskeletal:     Cervical back: Normal range of motion. No rigidity.     Lumbar back: Tenderness (paraspinal) present. No swelling, edema, signs of trauma, spasms or bony tenderness. Normal range of motion. Negative right straight leg raise test and negative left straight leg raise test.     Right lower leg: No edema.     Left lower leg: No edema.  Skin:    General: Skin is warm and dry.  Neurological:     General: No focal deficit present.     Mental Status: She is alert and oriented to person, place, and time.     Motor: No weakness.  Psychiatric:        Mood and Affect: Mood normal.        Behavior: Behavior normal.        Thought Content: Thought content normal.        Judgment: Judgment normal.     No results found for any visits on 09/27/22.      Assessment & Plan:   Shonie was seen today  for back pain and tick removal.  Diagnoses and all orders for this visit:  Acute right-sided low back pain without sciatica Started after lifting. Benign exam, no red flags. Mobic as below. Do not take other NAIDs with this. Flexeril prn spasms.  -     cyclobenzaprine (FLEXERIL) 5 MG tablet; Take 1 tablet (5 mg total) by mouth 3 (three) times daily as needed for muscle spasms. -     meloxicam (MOBIC) 7.5 MG tablet; Take 1 tablet (7.5 mg total) by mouth daily.  Tick bite of multiple sites 1 month ago. Report fatigue, denies other symptoms. Will check lyme panel and treat if positive.  -     Lyme Disease Serology w/Reflex  Return to office for new or worsening symptoms, or if symptoms persist.   The patient indicates understanding of these  issues and agrees with the plan.   Gabriel Earing, FNP

## 2022-09-28 ENCOUNTER — Encounter: Payer: Self-pay | Admitting: Family Medicine

## 2022-09-29 LAB — LYME DISEASE SEROLOGY W/REFLEX: Lyme Total Antibody EIA: NEGATIVE

## 2022-10-24 ENCOUNTER — Other Ambulatory Visit: Payer: Self-pay | Admitting: Family Medicine

## 2022-10-24 DIAGNOSIS — M545 Low back pain, unspecified: Secondary | ICD-10-CM

## 2023-01-22 ENCOUNTER — Other Ambulatory Visit: Payer: Self-pay

## 2023-01-22 ENCOUNTER — Emergency Department (HOSPITAL_BASED_OUTPATIENT_CLINIC_OR_DEPARTMENT_OTHER): Payer: Medicare Other | Admitting: Radiology

## 2023-01-22 ENCOUNTER — Emergency Department (HOSPITAL_BASED_OUTPATIENT_CLINIC_OR_DEPARTMENT_OTHER): Payer: Medicare Other

## 2023-01-22 ENCOUNTER — Emergency Department (HOSPITAL_BASED_OUTPATIENT_CLINIC_OR_DEPARTMENT_OTHER)
Admission: EM | Admit: 2023-01-22 | Discharge: 2023-01-22 | Disposition: A | Discharge status: Home / Self Care | Payer: Medicare Other | Attending: Emergency Medicine | Admitting: Emergency Medicine

## 2023-01-22 ENCOUNTER — Encounter (HOSPITAL_BASED_OUTPATIENT_CLINIC_OR_DEPARTMENT_OTHER): Payer: Self-pay | Admitting: Emergency Medicine

## 2023-01-22 DIAGNOSIS — R000 Tachycardia, unspecified: Secondary | ICD-10-CM | POA: Insufficient documentation

## 2023-01-22 DIAGNOSIS — U071 COVID-19: Secondary | ICD-10-CM | POA: Diagnosis not present

## 2023-01-22 DIAGNOSIS — J479 Bronchiectasis, uncomplicated: Secondary | ICD-10-CM | POA: Diagnosis not present

## 2023-01-22 DIAGNOSIS — R531 Weakness: Secondary | ICD-10-CM | POA: Diagnosis not present

## 2023-01-22 DIAGNOSIS — R5383 Other fatigue: Secondary | ICD-10-CM | POA: Diagnosis not present

## 2023-01-22 DIAGNOSIS — Z8616 Personal history of COVID-19: Secondary | ICD-10-CM | POA: Diagnosis not present

## 2023-01-22 DIAGNOSIS — R079 Chest pain, unspecified: Secondary | ICD-10-CM | POA: Diagnosis not present

## 2023-01-22 DIAGNOSIS — D71 Functional disorders of polymorphonuclear neutrophils: Secondary | ICD-10-CM | POA: Diagnosis not present

## 2023-01-22 DIAGNOSIS — R918 Other nonspecific abnormal finding of lung field: Secondary | ICD-10-CM | POA: Diagnosis not present

## 2023-01-22 LAB — CBC
HCT: 38.7 % (ref 36.0–46.0)
Hemoglobin: 12.2 g/dL (ref 12.0–15.0)
MCH: 28.4 pg (ref 26.0–34.0)
MCHC: 31.5 g/dL (ref 30.0–36.0)
MCV: 90 fL (ref 80.0–100.0)
Platelets: 250 10*3/uL (ref 150–400)
RBC: 4.3 MIL/uL (ref 3.87–5.11)
RDW: 13.2 % (ref 11.5–15.5)
WBC: 9.4 10*3/uL (ref 4.0–10.5)
nRBC: 0 % (ref 0.0–0.2)

## 2023-01-22 LAB — D-DIMER, QUANTITATIVE: D-Dimer, Quant: 0.88 ug{FEU}/mL — ABNORMAL HIGH (ref 0.00–0.50)

## 2023-01-22 LAB — BASIC METABOLIC PANEL
Anion gap: 8 (ref 5–15)
BUN: 18 mg/dL (ref 8–23)
CO2: 27 mmol/L (ref 22–32)
Calcium: 9.9 mg/dL (ref 8.9–10.3)
Chloride: 102 mmol/L (ref 98–111)
Creatinine, Ser: 0.75 mg/dL (ref 0.44–1.00)
GFR, Estimated: >60 mL/min (ref >60)
Glucose, Bld: 135 mg/dL — ABNORMAL HIGH (ref 70–99)
Potassium: 3.5 mmol/L (ref 3.5–5.1)
Sodium: 137 mmol/L (ref 135–145)

## 2023-01-22 LAB — TROPONIN I (HIGH SENSITIVITY): Troponin I (High Sensitivity): 3 ng/L (ref <18)

## 2023-01-22 LAB — MAGNESIUM: Magnesium: 2.1 mg/dL (ref 1.7–2.4)

## 2023-01-22 MED ORDER — AZITHROMYCIN 250 MG PO TABS: 250.0000 mg | ORAL_TABLET | Freq: Every day | ORAL | 0 refills | Status: DC

## 2023-01-22 MED ORDER — ACETAMINOPHEN 500 MG PO TABS
1000.0000 mg | ORAL_TABLET | Freq: Once | ORAL | Status: AC
  Administered 2023-01-22: 500 mg via ORAL
  Filled 2023-01-22: qty 2

## 2023-01-22 MED ORDER — IOHEXOL 350 MG/ML SOLN
100.0000 mL | Freq: Once | INTRAVENOUS | Status: AC | PRN
  Administered 2023-01-22: 75 mL via INTRAVENOUS

## 2023-01-22 MED ORDER — LACTATED RINGERS IV BOLUS
1000.0000 mL | Freq: Once | INTRAVENOUS | Status: AC
  Administered 2023-01-22: 1000 mL via INTRAVENOUS

## 2023-01-22 NOTE — ED Notes (Signed)
Discharge paperwork given and verbally understood. 

## 2023-01-22 NOTE — ED Provider Notes (Signed)
[place] EMERGENCY DEPARTMENT AT [facility] Provider Note   CSN: [id number] Arrival date & time: 01/22/23  1401     History {Add pertinent medical, surgical, social history, OB history to HPI:1} Chief Complaint  Patient presents with   Tachycardia    Melinda Bennett is a 67 y.o. female.  HPI  Patient is a 67 year old female  She presents emergency room today with complaints of fatigue.  She states that 1 month ago she contracted COVID-19 and was house ridden for approximately 1 week and felt quite fatigued for 2 weeks.  She states that she began improving symptomatically has felt generally well over the past week until a few days ago when she felt like her fatigue got worse.  She states that she is quite active she is not noticing that she is more winded than usual however she did check her heart rate today and found it to be in the 125-130 region.  No history of cancer, no hemoptysis, no chest pain or difficulty breathing.  She denies any fevers or chills, cough or sinus congestion.  No abdominal pain nausea vomiting or diarrhea.  She states that she is not eating or drinking quite as much as she normally does that she has had a decreased appetite since her COVID-19 infection 1 month ago.  She did recently have a dental implant done and was on amoxicillin prophylactically during this.  Has not had increased mouth swelling or tooth pain.     Home Medications Prior to Admission medications   Medication Sig Start Date End Date Taking? Authorizing Provider  amoxicillin (AMOXIL) 500 MG tablet Take 500 mg by mouth 3 (three) times daily. 01/21/23  Yes [provider]  azithromycin (ZITHROMAX) 250 MG tablet Take 1 tablet (250 mg total) by mouth daily. Take first 2 tablets together, then 1 every day until finished. 01/22/23  Yes Neera Teng S, PA  alendronate (FOSAMAX) 70 MG tablet TAKE 1 TABLET ONCE A WEEK WITH A FULL GLASS OF WATER ON AN EMPTY STOMACH 08/08/22   Dettinger,  Elige Radon, MD  Calcium Carbonate-Vit D-Min (CALCIUM 1200 PO) Take by mouth daily.    [provider]  cyclobenzaprine (FLEXERIL) 5 MG tablet Take 1 tablet (5 mg total) by mouth 3 (three) times daily as needed for muscle spasms. 09/27/22   Gabriel Earing, FNP  meloxicam (MOBIC) 7.5 MG tablet TAKE 1 TABLET BY MOUTH EVERY DAY 10/24/22   Dettinger, Elige Radon, MD  rosuvastatin (CRESTOR) 5 MG tablet 3-5 times weekly as tolerated 09/03/22   Dettinger, Elige Radon, MD  VITAMIN D PO Take by mouth.    [provider]      Allergies    Patient has no known allergies.    Review of Systems   Review of Systems  Physical Exam Updated Vital Signs BP (!) 100/57   Pulse 95   Temp 98 F (36.7 C)   Resp (!) 22   SpO2 100%  Physical Exam Vitals and nursing note reviewed.  Constitutional:      General: She is not in acute distress. HENT:     Head: Normocephalic and atraumatic.     Nose: Nose normal.     Mouth/Throat:     Comments: Slightly dry  Eyes:     General: No scleral icterus. Cardiovascular:     Rate and Rhythm: Regular rhythm. Tachycardia present.     Pulses: Normal pulses.     Heart sounds: Normal heart sounds.  Comments: Reg rhythm Pulmonary:     Effort: Pulmonary effort is normal. No respiratory distress.     Breath sounds: No wheezing.  Abdominal:     Palpations: Abdomen is soft.     Tenderness: There is no abdominal tenderness. There is no guarding or rebound.  Musculoskeletal:     Cervical back: Normal range of motion.     Right lower leg: No edema.     Left lower leg: No edema.  Skin:    General: Skin is warm and dry.     Capillary Refill: Capillary refill takes less than 2 seconds.  Neurological:     Mental Status: She is alert. Mental status is at baseline.  Psychiatric:        Mood and Affect: Mood normal.        Behavior: Behavior normal.     ED Results / Procedures / Treatments   Labs (all labs ordered are listed, but only abnormal results  are displayed) Labs Reviewed  BASIC METABOLIC PANEL - Abnormal; Notable for the following components:      Result Value   Glucose, Bld 135 (*)    All other components within normal limits  D-DIMER, QUANTITATIVE - Abnormal; Notable for the following components:   D-Dimer, Quant 0.88 (*)    All other components within normal limits  CBC  MAGNESIUM  TROPONIN I (HIGH SENSITIVITY)    EKG EKG Interpretation Date/Time:  Tuesday January 22 2023 14:10:01 EDT Ventricular Rate:  119 PR Interval:  118 QRS Duration:  86 QT Interval:  308 QTC Calculation: 433 R Axis:   84  Text Interpretation: Sinus tachycardia Biatrial enlargement Nonspecific ST abnormality Abnormal ECG No previous ECGs available Confirmed by Gwyneth Sprout ([id number]) on 01/22/2023 2:11:02 PM  Radiology CT Angio Chest PE W/Cm &/Or Wo Cm  Result Date: 01/22/2023 CLINICAL DATA:  Fatigue and weakness. COVID-19 diagnosed 1 month ago. Positive D-dimer. EXAM: CT ANGIOGRAPHY CHEST WITH CONTRAST TECHNIQUE: Multidetector CT imaging of the chest was performed using the standard protocol during bolus administration of intravenous contrast. Multiplanar CT image reconstructions and MIPs were obtained to evaluate the vascular anatomy. RADIATION DOSE REDUCTION: This exam was performed according to the departmental dose-optimization program which includes automated exposure control, adjustment of the mA and/or kV according to patient size and/or use of iterative reconstruction technique. CONTRAST:  75mL OMNIPAQUE IOHEXOL 350 MG/ML SOLN COMPARISON:  Chest radiographs dated 01/22/2023 FINDINGS: Cardiovascular: Satisfactory opacification of the pulmonary arteries to the segmental level. No evidence of pulmonary embolism. Normal heart size. No pericardial effusion. Mediastinum/Nodes: Normal-sized calcified mediastinal and right hilar lymph nodes. Unremarkable thyroid gland, esophagus and trachea. Lungs/Pleura: Multiple right lung calcified granulomata.  Mild biapical pleural and parenchymal scarring. Posterior lingular cylindrical and cystic bronchiectasis associated parenchymal densities. 4 mm left upper lobe nodule on image number 65/6, better seen on sagittal image number 112/8. Larger, more linearly shaped nodular density more superiorly in the left lower lobe superior segment. Superiorly, this appears to represent a confluence of smaller nodular densities. This measures 17 x 8 mm on sagittal image number 113/8 and 20 mm in width on coronal image number 29/7. 2 mm subpleural nodule in the 2 mm peripheral nodule in the lateral left upper lobe on image number 59/6. Similar sized nodule more anteriorly in the lateral left upper lobe on that image. No pleural fluid. Upper Abdomen: Multiple small calcified granulomata in the spleen. Musculoskeletal: Thoracic spine degenerative changes. Review of the MIP images confirms the above findings. IMPRESSION: 1.  No pulmonary emboli. 2. Multiple left lung nodules, the largest measuring 17 x 8 x 20 mm. These are most likely infectious or postinfectious in nature. However, malignancy is not excluded. Non-contrast chest CT at 3-6 months is recommended. If the nodules are stable at time of repeat CT, then future CT at 18-24 months (from today's scan) is considered optional for low-risk patients, but is recommended for high-risk patients. This recommendation follows the consensus statement: Guidelines for Management of Incidental Pulmonary Nodules Detected on CT Images: From the Fleischner Society 2017; Radiology 2017; 284:228-243. 3. Posterior lingular cylindrical and cystic bronchiectasis with associated parenchymal densities. This is most likely due to chronic atypical infection such as Mycobacterium avium intracellulare. 4. Old granulomatous disease. Electronically Signed   By: Beckie Salts M.D.   On: 01/22/2023 18:48   DG Chest 2 View  Result Date: 01/22/2023 CLINICAL DATA:  Chest pain, tachycardia and fatigue since  yesterday. EXAM: CHEST - 2 VIEW COMPARISON:  None Available. FINDINGS: Lungs are adequately inflated without effusion or pneumothorax. Subtle opacification over the lingula which may represent early infection. Cardiomediastinal silhouette is unremarkable. Calcified subcarinal lymph nodes present. Mild degenerative changes of the spine. IMPRESSION: Subtle opacification over the lingula which may represent early infection. Electronically Signed   By: Elberta Fortis M.D.   On: 01/22/2023 15:47    Procedures Procedures  {Document cardiac monitor, telemetry assessment procedure when appropriate:1}  Medications Ordered in ED Medications  lactated ringers bolus 1,000 mL (0 mLs Intravenous Stopped 01/22/23 1644)  acetaminophen (TYLENOL) tablet 1,000 mg (500 mg Oral Given 01/22/23 1543)  iohexol (OMNIPAQUE) 350 MG/ML injection 100 mL (75 mLs Intravenous Contrast Given 01/22/23 1742)    ED Course/ Medical Decision Making/ A&P Clinical Course as of 01/22/23 1922  Tue Jan 22, 2023  1444 1 month ago contracted covid and was house-ridden for ~1 week.  August - implant for tooth. Was on amox ( [WF]    Clinical Course User Index [WF] Gailen Shelter, PA   {   Click here for ABCD2, HEART and other calculatorsREFRESH Note before signing :1}                              Medical Decision Making Amount and/or Complexity of Data Reviewed Labs: ordered. Radiology: ordered.  Risk OTC drugs.   This patient presents to the ED for concern of fatigue, this involves a number of treatment options, and is a complaint that carries with it a moderate to high risk of complications and morbidity. A differential diagnosis was considered for the patient's symptoms which is discussed below:   The differential diagnosis of weakness includes but is not limited to neurologic causes (GBS, myasthenia gravis, CVA, MS, ALS, transverse myelitis, spinal cord injury, CVA, botulism, ) and other causes: ACS, Arrhythmia, syncope,  orthostatic hypotension, sepsis, hypoglycemia, electrolyte disturbance, hypothyroidism, respiratory failure, symptomatic anemia, dehydration, heat injury, polypharmacy, malignancy.   Co morbidities: Discussed in HPI   Brief History:  Patient is a 67 year old female  She presents emergency room today with complaints of fatigue.  She states that 1 month ago she contracted COVID-19 and was house ridden for approximately 1 week and felt quite fatigued for 2 weeks.  She states that she began improving symptomatically has felt generally well over the past week until a few days ago when she felt like her fatigue got worse.  She states that she is quite active she is not noticing that  she is more winded than usual however she did check her heart rate today and found it to be in the 125-130 region.  No history of cancer, no hemoptysis, no chest pain or difficulty breathing.  She denies any fevers or chills, cough or sinus congestion.  No abdominal pain nausea vomiting or diarrhea.  She states that she is not eating or drinking quite as much as she normally does that she has had a decreased appetite since her COVID-19 infection 1 month ago.  She did recently have a dental implant done and was on amoxicillin prophylactically during this.  Has not had increased mouth swelling or tooth pain.    EMR reviewed including pt PMHx, past surgical history and past visits to ER.   See HPI for more details   Lab Tests:  I personally reviewed all laboratory work and imaging. Metabolic panel without any acute abnormality specifically kidney function within normal limits and no significant electrolyte abnormalities. CBC without leukocytosis or significant anemia.   Imaging Studies:  Abnormal findings. I personally reviewed all imaging studies. Imaging notable for  IMPRESSION:  Subtle opacification over the lingula which may represent early  infection.      Cardiac Monitoring:  The patient was  maintained on a cardiac monitor.  I personally viewed and interpreted the cardiac monitored which showed an underlying rhythm of: Sinus tachycardia improved NSR EKG non-ischemic   Medicines ordered:  I ordered medication including lactated Ringer's, Tylenol for tachycardia Reevaluation of the patient after these medicines showed that the patient resolved I have reviewed the patients home medicines and have made adjustments as needed   Critical Interventions:     Consults/Attending Physician   I discussed this case with my attending physician who cosigned this note including patient's presenting symptoms, physical exam, and planned diagnostics and interventions. Attending physician stated agreement with plan or made changes to plan which were implemented.   Reevaluation:  After the interventions noted above I re-evaluated patient and found that they have :stayed the same   Social Determinants of Health:      Problem List / ED Course:  ***   Dispostion:  After consideration of the diagnostic results and the patients response to treatment, I feel that the patent would benefit from outpatient pulmonology follow-up.   Final Clinical Impression(s) / ED Diagnoses Final diagnoses:  Other fatigue    Rx / DC Orders ED Discharge Orders          Ordered    azithromycin (ZITHROMAX) 250 MG tablet  Daily        01/22/23 1920    Ambulatory referral to Pulmonology       Comments: Possible MAC   01/22/23 1921

## 2023-01-22 NOTE — Discharge Instructions (Addendum)
Please follow-up with pulmonologist and your primary care provider.  I have given you the information for our pulmonology group to follow-up with.

## 2023-01-22 NOTE — ED Triage Notes (Signed)
Covid 1 month ago- fatigue/ weakness since  Today tried home (kardia mobile) EKG device with hr 125  No cp, no sob

## 2023-01-23 ENCOUNTER — Telehealth: Payer: Self-pay | Admitting: Family Medicine

## 2023-01-23 ENCOUNTER — Ambulatory Visit (INDEPENDENT_AMBULATORY_CARE_PROVIDER_SITE_OTHER): Payer: Medicare Other | Admitting: Family Medicine

## 2023-01-23 ENCOUNTER — Encounter: Payer: Self-pay | Admitting: Family Medicine

## 2023-01-23 VITALS — BP 116/74 | HR 101 | Temp 97.2°F | Ht 67.0 in | Wt 134.2 lb

## 2023-01-23 DIAGNOSIS — E559 Vitamin D deficiency, unspecified: Secondary | ICD-10-CM

## 2023-01-23 DIAGNOSIS — W57XXXA Bitten or stung by nonvenomous insect and other nonvenomous arthropods, initial encounter: Secondary | ICD-10-CM

## 2023-01-23 DIAGNOSIS — R000 Tachycardia, unspecified: Secondary | ICD-10-CM

## 2023-01-23 DIAGNOSIS — E782 Mixed hyperlipidemia: Secondary | ICD-10-CM | POA: Diagnosis not present

## 2023-01-23 DIAGNOSIS — R011 Cardiac murmur, unspecified: Secondary | ICD-10-CM

## 2023-01-23 DIAGNOSIS — M791 Myalgia, unspecified site: Secondary | ICD-10-CM | POA: Diagnosis not present

## 2023-01-23 DIAGNOSIS — R7309 Other abnormal glucose: Secondary | ICD-10-CM | POA: Diagnosis not present

## 2023-01-23 LAB — BAYER DCA HB A1C WAIVED: HB A1C (BAYER DCA - WAIVED): 5.4 % (ref 4.8–5.6)

## 2023-01-23 NOTE — Telephone Encounter (Signed)
Fine to order but will likely need to be drawn

## 2023-01-23 NOTE — Telephone Encounter (Signed)
Pt has a one year follow up scheduled with Dettinger in Feb.  Please call pt and inform that both Dettinger and [facility] have agreed to change her PCP to [facility].   Then schedule yearly check with [facility] in February. Thank you!

## 2023-01-23 NOTE — Telephone Encounter (Signed)
I have no problem with that 

## 2023-01-23 NOTE — Progress Notes (Signed)
Subjective:  Patient ID: Melinda Bennett, female    DOB: 01-Feb-1956, 67 y.o.   MRN: [id number]  Patient Care Team: Dettinger, Elige Radon, MD as PCP - General (Family Medicine) Danella Maiers, [facility] as Pharmacist (Family Medicine)   Chief Complaint:  Tachycardia (Patient states started a few days ago. When to ER yesterday )   HPI: Melinda Bennett is a 67 y.o. female presenting on 01/23/2023 for Tachycardia (Patient states started a few days ago. When to ER yesterday )   Tachycardia Was seen in the ED yesterday and now tachycardia has resolved. States she wanted to keep the visit today and she has been having palpitations and intermittent tachycardia for several weeks. States she had COVID and was confined to her house for a month. States since this time she has had malaise, myalgias, arthralgias, and worsening fatigue. She was started on Vit D for deficiency and statin therapy for her cholesterol and is concerned this may be contributing to her symptoms.       Relevant past medical, surgical, family, and social history reviewed and updated as indicated.  Allergies and medications reviewed and updated. Data reviewed: Chart in Epic.   Past Medical History:  Diagnosis Date   Osteoporosis    Rosacea     Past Surgical History:  Procedure Laterality Date   BREAST BIOPSY Left    1995   DENTAL SURGERY     GALLBLADDER SURGERY      Social History   Socioeconomic History   Marital status: Married    Spouse name: Programme researcher, broadcasting/film/video   Number of children: 4   Years of education: 16   Highest education level: Master's degree (e.g., MA, MS, MEng, MEd, MSW, MBA)  Occupational History   Not on file  Tobacco Use   Smoking status: Never   Smokeless tobacco: Never  Vaping Use   Vaping status: Never Used  Substance and Sexual Activity   Alcohol use: Never   Drug use: Never   Sexual activity: Yes    Comment: married  since 1982, 4 children all grown  Other Topics Concern   Not on file  Social History  Narrative   Not on file   Social Determinants of Health   Financial Resource Strain: Low Risk  (11/02/2019)   Overall Financial Resource Strain (CARDIA)    Difficulty of Paying Living Expenses: Not hard at all  Food Insecurity: No Food Insecurity (11/02/2019)   Hunger Vital Sign    Worried About Running Out of Food in the Last Year: Never true    Ran Out of Food in the Last Year: Never true  Transportation Needs: No Transportation Needs (09/26/2022)   PRAPARE - Administrator, Civil Service (Medical): No    Lack of Transportation (Non-Medical): No  Physical Activity: Unknown (09/26/2022)   Exercise Vital Sign    Days of Exercise per Week: 3 days    Minutes of Exercise per Session: Not on file  Stress: No Stress Concern Present (09/26/2022)   [employer] of Occupational Health - Occupational Stress Questionnaire    Feeling of Stress : Not at all  Social Connections: Unknown (09/26/2022)   Social Connection and Isolation Panel [NHANES]    Frequency of Communication with Friends and Family: More than three times a week    Frequency of Social Gatherings with Friends and Family: Not on file    Attends Religious Services: More than 4 times per year    Active Member of [employer]  or Organizations: Patient declined    Attends Banker Meetings: Not on file    Marital Status: Married  Intimate Partner Violence: Unknown (08/17/2021)   Received from [facility]   HITS    Physically Hurt: Not on file    Insult or Talk Down To: Not on file    Threaten Physical Harm: Not on file    Scream or Curse: Not on file    Outpatient Encounter Medications as of 01/23/2023  Medication Sig   alendronate (FOSAMAX) 70 MG tablet TAKE 1 TABLET ONCE A WEEK WITH A FULL GLASS OF WATER ON AN EMPTY STOMACH   amoxicillin (AMOXIL) 500 MG tablet Take 500 mg by mouth 3 (three) times daily.   Calcium Carbonate-Vit D-Min (CALCIUM 1200 PO) Take by mouth daily.   cyclobenzaprine (FLEXERIL) 5 MG  tablet Take 1 tablet (5 mg total) by mouth 3 (three) times daily as needed for muscle spasms.   meloxicam (MOBIC) 7.5 MG tablet TAKE 1 TABLET BY MOUTH EVERY DAY   rosuvastatin (CRESTOR) 5 MG tablet 3-5 times weekly as tolerated   VITAMIN D PO Take by mouth.   azithromycin (ZITHROMAX) 250 MG tablet Take 1 tablet (250 mg total) by mouth daily. Take first 2 tablets together, then 1 every day until finished. (Patient not taking: Reported on 01/23/2023)   No facility-administered encounter medications on file as of 01/23/2023.    No Known Allergies  Review of Systems  Constitutional:  Positive for activity change, appetite change and fatigue. Negative for chills, diaphoresis, fever and unexpected weight change.  Respiratory:  Negative for cough and shortness of breath.   Cardiovascular:  Positive for palpitations. Negative for chest pain and leg swelling.  Gastrointestinal:  Negative for abdominal pain, diarrhea, nausea and vomiting.  Genitourinary:  Negative for decreased urine volume and difficulty urinating.  Musculoskeletal:  Positive for arthralgias and myalgias.  Neurological:  Negative for dizziness, tremors, seizures, syncope, facial asymmetry, speech difficulty, weakness, light-headedness, numbness and headaches.  Psychiatric/Behavioral:  Negative for confusion and decreased concentration.   All other systems reviewed and are negative.       Objective:  BP 116/74   Pulse (!) 101   Temp (!) 97.2 F (36.2 C) (Temporal)   Ht 5\' 7"  (1.702 m)   Wt 134 lb 3.2 oz (60.9 kg)   SpO2 96%   BMI 21.02 kg/m    Wt Readings from Last 3 Encounters:  01/23/23 134 lb 3.2 oz (60.9 kg)  09/27/22 133 lb (60.3 kg)  06/18/22 134 lb (60.8 kg)    Physical Exam Vitals and nursing note reviewed.  Constitutional:      General: She is not in acute distress.    Appearance: Normal appearance. She is well-developed and well-groomed. She is not ill-appearing, toxic-appearing or diaphoretic.  HENT:      Head: Normocephalic and atraumatic.     Jaw: There is normal jaw occlusion.     Right Ear: Hearing normal.     Left Ear: Hearing normal.     Nose: Nose normal.     Mouth/Throat:     Lips: Pink.     Mouth: Mucous membranes are moist.     Pharynx: Oropharynx is clear. Uvula midline.  Eyes:     General: Lids are normal.     Extraocular Movements: Extraocular movements intact.     Conjunctiva/sclera: Conjunctivae normal.     Pupils: Pupils are equal, round, and reactive to light.  Neck:     Thyroid: No  thyroid mass, thyromegaly or thyroid tenderness.     Vascular: No carotid bruit or JVD.     Trachea: Trachea and phonation normal.  Cardiovascular:     Rate and Rhythm: Regular rhythm. Tachycardia present.     Chest Wall: PMI is not displaced.     Pulses: Normal pulses.     Heart sounds: Murmur heard.     Systolic murmur is present with a grade of 1/6.     No friction rub. No gallop.  Pulmonary:     Effort: Pulmonary effort is normal. No respiratory distress.     Breath sounds: Normal breath sounds. No wheezing.  Abdominal:     General: Bowel sounds are normal. There is no distension or abdominal bruit.     Palpations: Abdomen is soft. There is no hepatomegaly or splenomegaly.     Tenderness: There is no abdominal tenderness. There is no right CVA tenderness or left CVA tenderness.     Hernia: No hernia is present.  Musculoskeletal:        General: Normal range of motion.     Cervical back: Normal range of motion and neck supple.     Right lower leg: No edema.     Left lower leg: No edema.  Lymphadenopathy:     Cervical: No cervical adenopathy.  Skin:    General: Skin is warm and dry.     Capillary Refill: Capillary refill takes less than 2 seconds.     Coloration: Skin is not cyanotic, jaundiced or pale.     Findings: No rash.  Neurological:     General: No focal deficit present.     Mental Status: She is alert and oriented to person, place, and time.     Sensory:  Sensation is intact.     Motor: Motor function is intact.     Coordination: Coordination is intact.     Gait: Gait is intact.     Deep Tendon Reflexes: Reflexes are normal and symmetric.  Psychiatric:        Attention and Perception: Attention and perception normal.        Mood and Affect: Mood and affect normal.        Speech: Speech normal.        Behavior: Behavior normal. Behavior is cooperative.        Thought Content: Thought content normal.        Cognition and Memory: Cognition and memory normal.        Judgment: Judgment normal.     Results for orders placed or performed in visit on 01/23/23  Bayer DCA Hb A1c Waived  Result Value Ref Range   HB A1C (BAYER DCA - WAIVED) 5.4 4.8 - 5.6 %       Pertinent labs & imaging results that were available during my care of the patient were reviewed by me and considered in my medical decision making.  Assessment & Plan:  Saleta was seen today for tachycardia.  Diagnoses and all orders for this visit:  Tachycardia Cardiac murmur Will check below labs and echo to evaluate for structural heart disease. Pt aware of red flags that require emergent evaluation and treatment.  -     Thyroid Panel With TSH -     CK total and CKMB (cardiac)not at [facility] -     CMP14+EGFR -     ECHOCARDIOGRAM COMPLETE; Future  Vitamin D deficiency Will repeat labs today.  -     VITAMIN D 25 Hydroxy (  Vit-D Deficiency, Fractures)  Elevated glucose Will repeat labs today and start medications if warranted.  -     Bayer DCA Hb A1c Waived -     CMP14+EGFR  Mixed hyperlipidemia Myalgia  Will repeat lipids and check CK/CKMB for possible statin induced myopathies.  -     Lipid panel -     CK total and CKMB (cardiac)not at [facility] -     CMP14+EGFR     Continue all other maintenance medications.  Follow up plan: Return if symptoms worsen or fail to improve.   Continue healthy lifestyle choices, including diet (rich in fruits, vegetables, and lean  proteins, and low in salt and simple carbohydrates) and exercise (at least 30 minutes of moderate physical activity daily).  Educational handout given for ST  The above assessment and management plan was discussed with the patient. The patient verbalized understanding of and has agreed to the management plan. Patient is aware to call the clinic if they develop any new symptoms or if symptoms persist or worsen. Patient is aware when to return to the clinic for a follow-up visit. Patient educated on when it is appropriate to go to the emergency department.   Kari Baars, FNP-C [facility] [phone]

## 2023-01-23 NOTE — Telephone Encounter (Signed)
That is ok with me  

## 2023-01-24 ENCOUNTER — Telehealth: Payer: Self-pay

## 2023-01-24 ENCOUNTER — Other Ambulatory Visit: Payer: Medicare Other

## 2023-01-24 DIAGNOSIS — W57XXXA Bitten or stung by nonvenomous insect and other nonvenomous arthropods, initial encounter: Secondary | ICD-10-CM

## 2023-01-24 DIAGNOSIS — L308 Other specified dermatitis: Secondary | ICD-10-CM | POA: Diagnosis not present

## 2023-01-24 DIAGNOSIS — D225 Melanocytic nevi of trunk: Secondary | ICD-10-CM | POA: Diagnosis not present

## 2023-01-24 DIAGNOSIS — Z1283 Encounter for screening for malignant neoplasm of skin: Secondary | ICD-10-CM | POA: Diagnosis not present

## 2023-01-24 DIAGNOSIS — R000 Tachycardia, unspecified: Secondary | ICD-10-CM | POA: Diagnosis not present

## 2023-01-24 LAB — CMP14+EGFR
ALT: 18 IU/L (ref 0–32)
AST: 17 IU/L (ref 0–40)
Albumin: 4.2 g/dL (ref 3.9–4.9)
Alkaline Phosphatase: 73 IU/L (ref 44–121)
BUN/Creatinine Ratio: 17 (ref 12–28)
BUN: 12 mg/dL (ref 8–27)
Bilirubin Total: 0.4 mg/dL (ref 0.0–1.2)
CO2: 24 mmol/L (ref 20–29)
Calcium: 10.1 mg/dL (ref 8.7–10.3)
Chloride: 99 mmol/L (ref 96–106)
Creatinine, Ser: 0.69 mg/dL (ref 0.57–1.00)
Globulin, Total: 2 g/dL (ref 1.5–4.5)
Glucose: 97 mg/dL (ref 70–99)
Potassium: 4.1 mmol/L (ref 3.5–5.2)
Sodium: 136 mmol/L (ref 134–144)
Total Protein: 6.2 g/dL (ref 6.0–8.5)
eGFR: 94 mL/min/{1.73_m2} (ref >59)

## 2023-01-24 LAB — CK TOTAL AND CKMB (NOT AT ARMC)
CK-MB Index: 1.2 ng/mL (ref 0.0–5.3)
Total CK: 47 U/L (ref 32–182)

## 2023-01-24 LAB — THYROID PANEL WITH TSH
Free Thyroxine Index: 1.8 (ref 1.2–4.9)
T3 Uptake Ratio: 29 % (ref 24–39)
T4, Total: 6.2 ug/dL (ref 4.5–12.0)
TSH: 2.21 u[IU]/mL (ref 0.450–4.500)

## 2023-01-24 LAB — VITAMIN D 25 HYDROXY (VIT D DEFICIENCY, FRACTURES): Vit D, 25-Hydroxy: 32.4 ng/mL (ref 30.0–100.0)

## 2023-01-24 LAB — LIPID PANEL
Chol/HDL Ratio: 2.2 ratio (ref 0.0–4.4)
Cholesterol, Total: 159 mg/dL (ref 100–199)
HDL: 73 mg/dL (ref >39)
LDL Chol Calc (NIH): 70 mg/dL (ref 0–99)
Triglycerides: 86 mg/dL (ref 0–149)
VLDL Cholesterol Cal: 16 mg/dL (ref 5–40)

## 2023-01-24 NOTE — Telephone Encounter (Signed)
Future order placed 

## 2023-01-24 NOTE — Telephone Encounter (Signed)
Patient calling to let us know she went to dermatologist this morning and has a rash.  He told her it was an allergic reaction to Amoxicillin and has prescribed Prednisone and told her to use Benadryl cream and take Benadryl by mouth.  Patient calling to let us know.  Medication has been added to patient's allergy list.

## 2023-01-24 NOTE — Telephone Encounter (Signed)
Please add lab order. Pt coming in today at 12 to have this done.

## 2023-01-24 NOTE — Telephone Encounter (Signed)
Pt is aware of approval and appt has been scheduled with Kari Baars in February.

## 2023-01-25 LAB — LYME DISEASE SEROLOGY W/REFLEX: Lyme Total Antibody EIA: NEGATIVE

## 2023-01-28 DIAGNOSIS — L308 Other specified dermatitis: Secondary | ICD-10-CM | POA: Diagnosis not present

## 2023-02-05 ENCOUNTER — Other Ambulatory Visit: Payer: Medicare Other

## 2023-02-05 ENCOUNTER — Ambulatory Visit (INDEPENDENT_AMBULATORY_CARE_PROVIDER_SITE_OTHER): Payer: Medicare Other | Admitting: Family Medicine

## 2023-02-05 ENCOUNTER — Encounter: Payer: Self-pay | Admitting: Family Medicine

## 2023-02-05 VITALS — BP 122/64 | HR 91 | Temp 97.5°F | Ht 67.0 in | Wt 132.0 lb

## 2023-02-05 DIAGNOSIS — R000 Tachycardia, unspecified: Secondary | ICD-10-CM | POA: Diagnosis not present

## 2023-02-05 NOTE — Progress Notes (Signed)
Subjective:  Patient ID: Melinda Bennett, female    DOB: June 13, 1955, 67 y.o.   MRN: [id number]  Patient Care Team: Dettinger, Elige Radon, MD as PCP - General (Family Medicine) Danella Maiers, [facility] as Pharmacist (Family Medicine)   Chief Complaint:  Tachycardia (Seen 9/11 and has not improved. )   HPI: Melinda Bennett is a 67 y.o. female presenting on 02/05/2023 for Tachycardia (Seen 9/11 and has not improved. )   Pt returns today for follow up of tachycardia and palpitations. She was seen 01/23/2023 for same. This all started after having COVID. Labs on 01/23/2023 were unremarkable. She has an echo and appointment with pulmonology scheduled. States she continues to get episodes of tachycardia with the slightest amount of exertion. Palpitations and shortness of breath at times. No other associated symptoms.   Palpitations  This is a recurrent problem. The problem occurs intermittently. The problem has been waxing and waning. Associated symptoms include malaise/fatigue and shortness of breath. Pertinent negatives include no anxiety, chest fullness, chest pain, coughing, diaphoresis, dizziness, fever, irregular heartbeat, nausea, near-syncope, numbness, syncope, vomiting or weakness. Treatments tried: increased water, avoiding caffeine. The treatment provided no relief.        Relevant past medical, surgical, family, and social history reviewed and updated as indicated.  Allergies and medications reviewed and updated. Data reviewed: Chart in Epic.   Past Medical History:  Diagnosis Date   Osteoporosis    Rosacea     Past Surgical History:  Procedure Laterality Date   BREAST BIOPSY Left    1995   DENTAL SURGERY     GALLBLADDER SURGERY      Social History   Socioeconomic History   Marital status: Married    Spouse name: Programme researcher, broadcasting/film/video   Number of children: 4   Years of education: 16   Highest education level: Master's degree (e.g., MA, MS, MEng, MEd, MSW, MBA)  Occupational History    Not on file  Tobacco Use   Smoking status: Never   Smokeless tobacco: Never  Vaping Use   Vaping status: Never Used  Substance and Sexual Activity   Alcohol use: Never   Drug use: Never   Sexual activity: Yes    Comment: married  since 1982, 4 children all grown  Other Topics Concern   Not on file  Social History Narrative   Not on file   Social Determinants of Health   Financial Resource Strain: Low Risk  (11/02/2019)   Overall Financial Resource Strain (CARDIA)    Difficulty of Paying Living Expenses: Not hard at all  Food Insecurity: No Food Insecurity (11/02/2019)   Hunger Vital Sign    Worried About Running Out of Food in the Last Year: Never true    Ran Out of Food in the Last Year: Never true  Transportation Needs: No Transportation Needs (09/26/2022)   PRAPARE - Administrator, Civil Service (Medical): No    Lack of Transportation (Non-Medical): No  Physical Activity: Unknown (09/26/2022)   Exercise Vital Sign    Days of Exercise per Week: 3 days    Minutes of Exercise per Session: Not on file  Stress: No Stress Concern Present (09/26/2022)   [employer] of Occupational Health - Occupational Stress Questionnaire    Feeling of Stress : Not at all  Social Connections: Unknown (09/26/2022)   Social Connection and Isolation Panel [NHANES]    Frequency of Communication with Friends and Family: More than three times a week  Frequency of Social Gatherings with Friends and Family: Not on file    Attends Religious Services: More than 4 times per year    Active Member of [employer] or Organizations: Patient declined    Attends Banker Meetings: Not on file    Marital Status: Married  Intimate Partner Violence: Unknown (08/17/2021)   Received from [employer]   HITS    Physically Hurt: Not on file    Insult or Talk Down To: Not on file    Threaten Physical Harm: Not on file    Scream or Curse: Not on file    Outpatient Encounter  Medications as of 02/05/2023  Medication Sig   alendronate (FOSAMAX) 70 MG tablet TAKE 1 TABLET ONCE A WEEK WITH A FULL GLASS OF WATER ON AN EMPTY STOMACH   Calcium Carbonate-Vit D-Min (CALCIUM 1200 PO) Take by mouth daily.   cyclobenzaprine (FLEXERIL) 5 MG tablet Take 1 tablet (5 mg total) by mouth 3 (three) times daily as needed for muscle spasms.   meloxicam (MOBIC) 7.5 MG tablet TAKE 1 TABLET BY MOUTH EVERY DAY   rosuvastatin (CRESTOR) 5 MG tablet 3-5 times weekly as tolerated   VITAMIN D PO Take by mouth.   [DISCONTINUED] amoxicillin (AMOXIL) 500 MG tablet Take 500 mg by mouth 3 (three) times daily.   [DISCONTINUED] azithromycin (ZITHROMAX) 250 MG tablet Take 1 tablet (250 mg total) by mouth daily. Take first 2 tablets together, then 1 every day until finished. (Patient not taking: Reported on 01/23/2023)   No facility-administered encounter medications on file as of 02/05/2023.    Allergies  Allergen Reactions   Amoxicillin Rash   Penicillins Rash    Review of Systems  Constitutional:  Positive for activity change, fatigue and malaise/fatigue. Negative for appetite change, chills, diaphoresis, fever and unexpected weight change.  HENT: Negative.    Eyes: Negative.  Negative for photophobia and visual disturbance.  Respiratory:  Positive for shortness of breath. Negative for apnea, cough, choking, chest tightness, wheezing and stridor.   Cardiovascular:  Positive for palpitations. Negative for chest pain, leg swelling, syncope and near-syncope.  Gastrointestinal:  Negative for abdominal pain, blood in stool, constipation, diarrhea, nausea and vomiting.  Endocrine: Negative.   Genitourinary:  Negative for decreased urine volume, difficulty urinating, dysuria, frequency and urgency.  Musculoskeletal:  Negative for arthralgias and myalgias.  Skin: Negative.   Allergic/Immunologic: Negative.   Neurological:  Negative for dizziness, tremors, seizures, syncope, facial asymmetry, speech  difficulty, weakness, light-headedness, numbness and headaches.  Hematological: Negative.   Psychiatric/Behavioral:  Negative for confusion, hallucinations, sleep disturbance and suicidal ideas. The patient is not nervous/anxious.   All other systems reviewed and are negative.       Objective:  BP 122/64   Pulse 91   Temp (!) 97.5 F (36.4 C) (Temporal)   Ht 5\' 7"  (1.702 m)   Wt 132 lb (59.9 kg)   SpO2 95%   BMI 20.67 kg/m    Wt Readings from Last 3 Encounters:  02/05/23 132 lb (59.9 kg)  01/23/23 134 lb 3.2 oz (60.9 kg)  09/27/22 133 lb (60.3 kg)    Physical Exam Vitals and nursing note reviewed.  Constitutional:      General: She is not in acute distress.    Appearance: Normal appearance. She is normal weight. She is not ill-appearing, toxic-appearing or diaphoretic.  HENT:     Head: Normocephalic and atraumatic.     Mouth/Throat:     Mouth: Mucous membranes  are moist.  Eyes:     Pupils: Pupils are equal, round, and reactive to light.  Cardiovascular:     Rate and Rhythm: Normal rate and regular rhythm.     Heart sounds: Normal heart sounds.  Pulmonary:     Effort: Pulmonary effort is normal.     Breath sounds: Normal breath sounds.  Skin:    General: Skin is warm and dry.     Capillary Refill: Capillary refill takes less than 2 seconds.  Neurological:     General: No focal deficit present.     Mental Status: She is alert and oriented to person, place, and time.  Psychiatric:        Behavior: Behavior normal.        Thought Content: Thought content normal.        Judgment: Judgment normal.     Results for orders placed or performed in visit on 01/24/23  Lyme Disease Serology w/Reflex  Result Value Ref Range   Lyme Total Antibody EIA Negative Negative       Pertinent labs & imaging results that were available during my care of the patient were reviewed by me and considered in my medical decision making.  Assessment & Plan:  Luta was seen today for  tachycardia.  Diagnoses and all orders for this visit:  Tachycardia Will place on Zio to evaluate for arrhythmias. Has follow up with pulmonology scheduled and an echo ordered. She is recovering from COVID and this could be residual symptoms. If work-up is unremarkable, can consider sleep apnea testing.  -     LONG TERM MONITOR (3-14 DAYS); Future     Continue all other maintenance medications.  Follow up plan: Return in about 4 weeks (around 03/05/2023), or if symptoms worsen or fail to improve, for tachycardia.   Continue healthy lifestyle choices, including diet (rich in fruits, vegetables, and lean proteins, and low in salt and simple carbohydrates) and exercise (at least 30 minutes of moderate physical activity daily).  Educational handout given for ST  The above assessment and management plan was discussed with the patient. The patient verbalized understanding of and has agreed to the management plan. Patient is aware to call the clinic if they develop any new symptoms or if symptoms persist or worsen. Patient is aware when to return to the clinic for a follow-up visit. Patient educated on when it is appropriate to go to the emergency department.   Kari Baars, FNP-C [facility] [phone]

## 2023-02-20 ENCOUNTER — Ambulatory Visit (HOSPITAL_BASED_OUTPATIENT_CLINIC_OR_DEPARTMENT_OTHER): Payer: Medicare Other

## 2023-02-20 ENCOUNTER — Telehealth: Payer: Self-pay

## 2023-02-20 DIAGNOSIS — R011 Cardiac murmur, unspecified: Secondary | ICD-10-CM

## 2023-02-20 DIAGNOSIS — R000 Tachycardia, unspecified: Secondary | ICD-10-CM | POA: Diagnosis not present

## 2023-02-20 LAB — ECHOCARDIOGRAM COMPLETE
AR max vel: 1.88 cm2
AV Area VTI: 1.83 cm2
AV Area mean vel: 1.83 cm2
AV Mean grad: 3 mm[Hg]
AV Peak grad: 6.2 mm[Hg]
Ao pk vel: 1.24 m/s
Area-P 1/2: 3.99 cm2
S' Lateral: 2.94 cm

## 2023-02-20 NOTE — Telephone Encounter (Signed)
Transition Care Management Follow-up Telephone Call Date of discharge and from where: [facility] 9/10 How have you been since you were released from the hospital? Doing better and following up with providers Any questions or concerns? No  Items Reviewed: Did the pt receive and understand the discharge instructions provided? Yes  Medications obtained and verified? Yes Other? No  Any new allergies since your discharge? No  Dietary orders reviewed? No Do you have support at home? Yes    Follow up appointments reviewed:  PCP Hospital f/u appt confirmed? Yes  Scheduled to see  on  @  Specialist Hospital f/u appt confirmed? Yes  Scheduled to see  on  @ . Are transportation arrangements needed? No  If their condition worsens, is the pt aware to call PCP or go to the Emergency Dept.? Yes Was the patient provided with contact information for the PCP's office or ED? Yes Was to pt encouraged to call back with questions or concerns? Yes

## 2023-02-21 NOTE — Addendum Note (Signed)
Addended by: Sonny Masters on: 02/21/2023 03:39 PM   Modules accepted: Orders

## 2023-02-25 DIAGNOSIS — R000 Tachycardia, unspecified: Secondary | ICD-10-CM | POA: Diagnosis not present

## 2023-03-05 ENCOUNTER — Ambulatory Visit: Payer: Medicare Other | Admitting: Family Medicine

## 2023-03-05 ENCOUNTER — Encounter: Payer: Self-pay | Admitting: Family Medicine

## 2023-03-05 VITALS — BP 130/72 | HR 75 | Temp 96.9°F | Ht 67.0 in | Wt 132.0 lb

## 2023-03-05 DIAGNOSIS — R000 Tachycardia, unspecified: Secondary | ICD-10-CM

## 2023-03-05 DIAGNOSIS — Z23 Encounter for immunization: Secondary | ICD-10-CM | POA: Diagnosis not present

## 2023-03-05 MED ORDER — ATENOLOL 25 MG PO TABS
12.5000 mg | ORAL_TABLET | Freq: Every evening | ORAL | 3 refills | Status: DC
Start: 2023-03-05 — End: 2023-12-11

## 2023-03-05 MED ORDER — ATENOLOL 25 MG PO TABS
12.5000 mg | ORAL_TABLET | Freq: Every evening | ORAL | 3 refills | Status: DC
Start: 2023-03-05 — End: 2023-03-05

## 2023-03-05 NOTE — Progress Notes (Signed)
Subjective:  Patient ID: Melinda Bennett, female    DOB: [birth year], 67 y.o.   MRN: [id number]  Patient Care Team: Sonny Masters, FNP as PCP - General (Family Medicine) Danella Maiers, [facility] as Pharmacist (Family Medicine)   Chief Complaint:  Event Monitor Alert [facility]- 4 week follow up )   HPI: Melinda Bennett is a 67 y.o. female presenting on 03/05/2023 for Event Monitor Alert (Zio- 4 week follow up )   Discussed the use of AI scribe software for clinical note transcription with the patient, who gave verbal consent to proceed.  History of Present Illness   The patient, with a history of recent COVID-19 infection, presents with concerns about palpitations and tachycardia. They report feeling less tired and are able to perform their usual activities. However, they note that their heart feels like it is pounding, particularly at night, although not necessarily fast. They describe this sensation as more intense when they are tired or when they are lying down. They deny any symptoms of atrial fibrillation.  The patient also reports waking up at night due to these palpitations. They have noticed that their heart rate has been higher than their usual baseline of 76-78 beats per minute, with readings up to 100 beats per minute while at rest. They have been tracking their heart rate in the mornings and have noticed a decrease in their heart rate over time.  The patient's concerns have been exacerbated by a recent diagnosis of left atrial enlargement and an episode of ventricular tachycardia (VTAC) that occurred at night. They express anxiety about waiting for upcoming appointments with a cardiologist and a pulmonologist, which were scheduled following a recent visit to the emergency room and PCP office.          Relevant past medical, surgical, family, and social history reviewed and updated as indicated.  Allergies and medications reviewed and updated. Data reviewed: Chart in Epic.   Past Medical  History:  Diagnosis Date   Osteoporosis    Rosacea     Past Surgical History:  Procedure Laterality Date   BREAST BIOPSY Left    1995   DENTAL SURGERY     GALLBLADDER SURGERY      Social History   Socioeconomic History   Marital status: Married    Spouse name: Programme researcher, broadcasting/film/video   Number of children: 4   Years of education: 16   Highest education level: Master's degree (e.g., MA, MS, MEng, MEd, MSW, MBA)  Occupational History   Not on file  Tobacco Use   Smoking status: Never   Smokeless tobacco: Never  Vaping Use   Vaping status: Never Used  Substance and Sexual Activity   Alcohol use: Never   Drug use: Never   Sexual activity: Yes    Comment: married  since 1982, 4 children all grown  Other Topics Concern   Not on file  Social History Narrative   Not on file   Social Determinants of Health   Financial Resource Strain: Low Risk  (11/02/2019)   Overall Financial Resource Strain (CARDIA)    Difficulty of Paying Living Expenses: Not hard at all  Food Insecurity: No Food Insecurity (11/02/2019)   Hunger Vital Sign    Worried About Running Out of Food in the Last Year: Never true    Ran Out of Food in the Last Year: Never true  Transportation Needs: No Transportation Needs (09/26/2022)   PRAPARE - Administrator, Civil Service (Medical): No  Lack of Transportation (Non-Medical): No  Physical Activity: Unknown (09/26/2022)   Exercise Vital Sign    Days of Exercise per Week: 3 days    Minutes of Exercise per Session: Not on file  Stress: No Stress Concern Present (09/26/2022)   [employer] of Occupational Health - Occupational Stress Questionnaire    Feeling of Stress : Not at all  Social Connections: Unknown (09/26/2022)   Social Connection and Isolation Panel [NHANES]    Frequency of Communication with Friends and Family: More than three times a week    Frequency of Social Gatherings with Friends and Family: Not on file    Attends Religious Services: More  than 4 times per year    Active Member of [employer] or Organizations: Patient declined    Attends Banker Meetings: Not on file    Marital Status: Married  Intimate Partner Violence: Unknown (08/17/2021)   Received from [employer], [facility]   HITS    Physically Hurt: Not on file    Insult or Talk Down To: Not on file    Threaten Physical Harm: Not on file    Scream or Curse: Not on file    Outpatient Encounter Medications as of 03/05/2023  Medication Sig   alendronate (FOSAMAX) 70 MG tablet TAKE 1 TABLET ONCE A WEEK WITH A FULL GLASS OF WATER ON AN EMPTY STOMACH   Calcium Carbonate-Vit D-Min (CALCIUM 1200 PO) Take by mouth daily.   cyclobenzaprine (FLEXERIL) 5 MG tablet Take 1 tablet (5 mg total) by mouth 3 (three) times daily as needed for muscle spasms.   meloxicam (MOBIC) 7.5 MG tablet TAKE 1 TABLET BY MOUTH EVERY DAY   rosuvastatin (CRESTOR) 5 MG tablet 3-5 times weekly as tolerated   VITAMIN D PO Take by mouth.   [DISCONTINUED] atenolol (TENORMIN) 25 MG tablet Take 0.5 tablets (12.5 mg total) by mouth at bedtime.   atenolol (TENORMIN) 25 MG tablet Take 0.5 tablets (12.5 mg total) by mouth at bedtime.   No facility-administered encounter medications on file as of 03/05/2023.    Allergies  Allergen Reactions   Amoxicillin Rash   Penicillins Rash    Pertinent ROS per HPI, otherwise unremarkable      Objective:  BP 130/72   Pulse 75   Temp (!) 96.9 F (36.1 C) (Temporal)   Ht 5\' 7"  (1.702 m)   Wt 132 lb (59.9 kg)   SpO2 98%   BMI 20.67 kg/m    Wt Readings from Last 3 Encounters:  03/05/23 132 lb (59.9 kg)  02/05/23 132 lb (59.9 kg)  01/23/23 134 lb 3.2 oz (60.9 kg)    Physical Exam Vitals and nursing note reviewed.  Constitutional:      General: She is not in acute distress.    Appearance: Normal appearance. She is normal weight. She is not ill-appearing, toxic-appearing or diaphoretic.  HENT:     Head: Normocephalic and atraumatic.      Nose: Nose normal.     Mouth/Throat:     Mouth: Mucous membranes are moist.  Eyes:     Pupils: Pupils are equal, round, and reactive to light.  Cardiovascular:     Rate and Rhythm: Normal rate and regular rhythm.     Heart sounds: Normal heart sounds. No murmur heard.    No friction rub. No gallop.  Pulmonary:     Effort: Pulmonary effort is normal.     Breath sounds: Normal breath sounds.  Musculoskeletal:     Cervical  back: Normal range of motion and neck supple.     Right lower leg: No edema.     Left lower leg: No edema.  Skin:    General: Skin is warm and dry.     Capillary Refill: Capillary refill takes less than 2 seconds.  Neurological:     General: No focal deficit present.     Mental Status: She is alert and oriented to person, place, and time.  Psychiatric:        Mood and Affect: Mood normal.        Behavior: Behavior normal.        Thought Content: Thought content normal.        Judgment: Judgment normal.    Physical Exam   VITALS: P- 70      LABS Calcium: 10.1 (01/2023)  DIAGNOSTIC Echocardiography: Left atrial enlargement Zio Patch: Supraventricular tachycardia (SVT), V-Tach episodes, nothing sustained   Results for orders placed or performed in visit on 02/20/23  ECHOCARDIOGRAM COMPLETE  Result Value Ref Range   S' Lateral 2.94 cm   AV Area VTI 1.83 cm2   AV Mean grad 3.0 mmHg   AV Area mean vel 1.83 cm2   Area-P 1/2 3.99 cm2   AR max vel 1.88 cm2   AV Peak grad 6.2 mmHg   Ao pk vel 1.24 m/s   Est EF 60 - 65%        Pertinent labs & imaging results that were available during my care of the patient were reviewed by me and considered in my medical decision making.  Assessment & Plan:  Lizzet was seen today for event monitor alert.  Diagnoses and all orders for this visit:  Tachycardia -     atenolol (TENORMIN) 25 MG tablet; Take 0.5 tablets (12.5 mg total) by mouth at bedtime.     Assessment and Plan    Supraventricular Tachycardia  (SVT) Episodes of tachycardia noted on Zio monitor, including one episode in the middle of the night. Patient reports palpitations and feeling of heart pounding, particularly at night. No other triggers identified. Possible post-COVID syndrome. -Start Atenolol 12.5mg  at bedtime to control tachyarrhythmias and palpitations. -Refer to cardiologist for further evaluation and management.  Left Atrial Enlargement Noted on recent echocardiogram. Possible link to pulmonary issues. -Refer to pulmonologist for further evaluation and management.  General Health Maintenance -Continue with planned flu shot. -Discussed COVID-19 vaccination, left to patient's discretion.          Continue all other maintenance medications.  Follow up plan: Return if symptoms worsen or fail to improve.   Continue healthy lifestyle choices, including diet (rich in fruits, vegetables, and lean proteins, and low in salt and simple carbohydrates) and exercise (at least 30 minutes of moderate physical activity daily).   The above assessment and management plan was discussed with the patient. The patient verbalized understanding of and has agreed to the management plan. Patient is aware to call the clinic if they develop any new symptoms or if symptoms persist or worsen. Patient is aware when to return to the clinic for a follow-up visit. Patient educated on when it is appropriate to go to the emergency department.   Kari Baars, FNP-C [facility] [phone]

## 2023-03-18 ENCOUNTER — Encounter: Payer: Self-pay | Admitting: Internal Medicine

## 2023-03-18 ENCOUNTER — Ambulatory Visit (INDEPENDENT_AMBULATORY_CARE_PROVIDER_SITE_OTHER): Payer: Medicare Other | Admitting: Internal Medicine

## 2023-03-18 VITALS — BP 108/68 | HR 70 | Ht 67.0 in | Wt 132.0 lb

## 2023-03-18 DIAGNOSIS — J984 Other disorders of lung: Secondary | ICD-10-CM | POA: Diagnosis not present

## 2023-03-18 DIAGNOSIS — Z8616 Personal history of COVID-19: Secondary | ICD-10-CM | POA: Diagnosis not present

## 2023-03-18 DIAGNOSIS — R911 Solitary pulmonary nodule: Secondary | ICD-10-CM | POA: Diagnosis not present

## 2023-03-18 NOTE — Patient Instructions (Addendum)
ICD-10-CM   1. Left lower lobe pulmonary nodule  R91.1     2. History of 2019 novel coronavirus disease (COVID-19)  Z86.16     3. Calcified granuloma of lung  J98.4      There is a left upper lobe nodule that looks postinfectious post-COVID  Plan - Do blood test for QuantiFERON gold - Do CT scan of the chest without contrast to visit later part of December 2024 or early part of January 2025  -Do super D CT chest  Follow-up - Return to see Melinda Bennett or any of the nurse practitioners after the CT scan to discuss results  -If it is growing you might need a biopsy procedure

## 2023-03-18 NOTE — Progress Notes (Signed)
OV 03/18/2023  Subjective:  Patient ID: Melinda Bennett, female , DOB: February 24, 1956 , age 67 y.o. , MRN: [id number] , ADDRESS: [address] [place] [address] PCP Melinda Masters, FNP Patient Care Team: Melinda Masters, FNP as PCP - General (Family Medicine) Melinda Bennett, [facility] as Pharmacist (Family Medicine)  This Provider for this visit: Treatment Team:  Attending Provider: Kalman Shan, MD    03/18/2023 -   Chief Complaint  Patient presents with   Pulmonary Consult    Pulmonary nodule.      HPI Melinda Bennett 67 y.o. - Recent history Had covid in August 2024, this was her first COVID.-emergency room visit on 01/22/2023 because of COVID the month prior in August 2024 and was also written and fatigue for 2 weeks.  She mentioned her shortness of breath there is also history of amoxicillin prophylaxis for dental implant.  In the ED on 01/22/2023 pulm embolism was ruled out but CT angiogram chest showed nodule [see below].  I personally reviewed the external medical record from the ED I personally visualized the CT scan and agree with those findings.  She did not have any hemoptysis getting into this admission in the ER.  After that she followed with family nurse practitioner on 02/05/2023.  She complained of palpitations.  ZIO monitor was started.  She followed up with family nurse practitioner on 03/05/2023.  Continued palpitations tachycardia reported.  Echocardiography showed left atrial enlargement.  Zio patch showed supraventricular tachycardia and nonsustained V. tach.  Atenolol was started.  Cardiology referral has been made and pulmonary referral made.  Currently she is feeling well.  The atenolol has helped and the passage of time is helped with the palpitations.  There is no real shortness of breath.  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  Effort tolerance is similar to pre-COVID.  There is no cough or congestion no wheezing or hemoptysis or fever or night sweats or chills.  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  This puts her at risk for histoplasmosis infectious calcified granuloma - She lived in [place] for 20 years in the 1996 moved to [place] and recently has moved to [place] - No active smoking history - No radon exposure.  CT Chest data from date: sept 2024  - personally visualized and independently interpreted : yes - my findings are: as below Narrative & Impression  CLINICAL DATA:  Fatigue and weakness. COVID-19 diagnosed 1 month ago. Positive D-dimer.   EXAM: CT ANGIOGRAPHY CHEST WITH CONTRAST   TECHNIQUE: Multidetector CT imaging of the chest was performed using the standard protocol during bolus administration of intravenous contrast. Multiplanar CT image reconstructions and MIPs were obtained to evaluate the vascular anatomy.   RADIATION DOSE REDUCTION: This exam was performed according to the departmental dose-optimization program which includes automated exposure control, adjustment of the mA and/or kV according to patient size and/or use of iterative reconstruction technique.   CONTRAST:  75mL OMNIPAQUE IOHEXOL 350 MG/ML SOLN   COMPARISON:  Chest radiographs dated 01/22/2023   FINDINGS: Cardiovascular: Satisfactory opacification of the pulmonary arteries to the segmental level. No evidence of pulmonary embolism. Normal heart size. No pericardial effusion.   Mediastinum/Nodes: Normal-sized calcified mediastinal and right hilar lymph nodes.  Unremarkable thyroid gland, esophagus and trachea.   Lungs/Pleura: Multiple right lung calcified granulomata. Mild biapical pleural and parenchymal scarring. Posterior lingular cylindrical and cystic bronchiectasis associated parenchymal densities.   4 mm left upper lobe nodule on image  number 65/6, better seen on sagittal image number 112/8.   Larger, more linearly shaped nodular density more superiorly in the left lower lobe superior segment. Superiorly, this appears to represent a confluence of smaller nodular densities. This measures 17 x 8 mm on sagittal image number 113/8 and 20 mm in width on coronal image number 29/7.   2 mm subpleural nodule in the 2 mm peripheral nodule in the lateral left upper lobe on image number 59/6. Similar sized nodule more anteriorly in the lateral left upper lobe on that image.   No pleural fluid.   Upper Abdomen: Multiple small calcified granulomata in the spleen.   Musculoskeletal: Thoracic spine degenerative changes.   Review of the MIP images confirms the above findings.   IMPRESSION: 1. No pulmonary emboli. 2. Multiple left lung nodules, the largest measuring 17 x 8 x 20 mm. These are most likely infectious or postinfectious in nature. However, malignancy is not excluded. Non-contrast chest CT at 3-6 months is recommended. If the nodules are stable at time of repeat CT, then future CT at 18-24 months (from today's scan) is considered optional for low-risk patients, but is recommended for high-risk patients. This recommendation follows the consensus statement: Guidelines for Management of Incidental Pulmonary Nodules Detected on CT Images: From the Fleischner Society 2017; Radiology 2017; 284:228-243. 3. Posterior lingular cylindrical and cystic bronchiectasis with associated parenchymal densities. This is most likely due to chronic atypical infection such as Mycobacterium avium intracellulare. 4. Old granulomatous disease.     Electronically Signed   By: Melinda Bennett M.D.   On: 01/22/2023 18:48    PFT      No data to display            LAB RESULTS last 96 hours No results found.  LAB RESULTS last 90 days Recent Results (from the past 2160 hour(s))  Basic metabolic panel     Status: Abnormal    Collection Time: 01/22/23  2:10 PM  Result Value Ref Range   Sodium 137 135 - 145 mmol/L   Potassium 3.5 3.5 - 5.1 mmol/L   Chloride 102 98 - 111 mmol/L   CO2 27 22 - 32 mmol/L   Glucose, Bld 135 (H) 70 - 99 mg/dL    Comment: Glucose reference range applies only to samples taken after fasting for at least 8 hours.   BUN 18 8 - 23 mg/dL   Creatinine, Ser 2.53 0.44 - 1.00 mg/dL   Calcium 9.9 8.9 - 66.4 mg/dL   GFR, Estimated >40 >34 mL/min    Comment: (NOTE) Calculated using the CKD-EPI Creatinine Equation (2021)    Anion gap 8 5 - 15    Comment: Performed at [facility], [address] [place], [place] at [place], [place] [address]  CBC     Status: None   Collection Time: 01/22/23  2:10 PM  Result Value Ref Range   WBC 9.4 4.0 - 10.5 K/uL   RBC 4.30 3.87 - 5.11 MIL/uL   Hemoglobin 12.2 12.0 - 15.0 g/dL   HCT 56.3 87.5 - 64.3 %   MCV 90.0 80.0 - 100.0 fL   MCH 28.4 26.0 - 34.0 pg   MCHC 31.5 30.0 - 36.0 g/dL   RDW 32.9 51.8 - 84.1 %  Platelets 250 150 - 400 K/uL   nRBC 0.0 0.0 - 0.2 %    Comment: Performed at [facility], [address] [place], [place] [address]  Troponin I (High Sensitivity)     Status: None   Collection Time: 01/22/23  2:10 PM  Result Value Ref Range   Troponin I (High Sensitivity) 3 <18 ng/L    Comment: (NOTE) Elevated high sensitivity troponin I (hsTnI) values and significant  changes across serial measurements may suggest ACS but many other  chronic and acute conditions are known to elevate hsTnI results.  Refer to the "Links" section for chest pain algorithms and additional  guidance. Performed at [facility], [address] [place], [place] [address]   D-dimer, quantitative     Status: Abnormal   Collection Time: 01/22/23  3:49 PM  Result Value Ref Range   D-Dimer, Quant 0.88 (H) 0.00 - 0.50 ug/mL-FEU    Comment: (NOTE) At the manufacturer cut-off value of 0.5 g/mL FEU, this assay has a negative  predictive value of 95-100%.This assay is intended for use in conjunction with a clinical pretest probability (PTP) assessment model to exclude pulmonary embolism (PE) and deep venous thrombosis (DVT) in outpatients suspected of PE or DVT. Results should be correlated with clinical presentation. Performed at [facility], [address], [place], [place] [address]   Magnesium     Status: None   Collection Time: 01/22/23  3:49 PM  Result Value Ref Range   Magnesium 2.1 1.7 - 2.4 mg/dL    Comment: Performed at [facility], [address] [place], [place] [address]  [facility] Hb A1c Waived     Status: None   Collection Time: 01/23/23  8:25 AM  Result Value Ref Range   HB A1C (BAYER DCA - WAIVED) 5.4 4.8 - 5.6 %    Comment:          Prediabetes: 5.7 - 6.4          Diabetes: >6.4          Glycemic control for adults with diabetes: <7.0   Thyroid Panel With TSH     Status: None   Collection Time: 01/23/23  8:26 AM  Result Value Ref Range   TSH 2.210 0.450 - 4.500 uIU/mL   T4, Total 6.2 4.5 - 12.0 ug/dL   T3 Uptake Ratio 29 24 - 39 %   Free Thyroxine Index 1.8 1.2 - 4.9  VITAMIN D 25 Hydroxy (Vit-D Deficiency, Fractures)     Status: None   Collection Time: 01/23/23  8:26 AM  Result Value Ref Range   Vit D, 25-Hydroxy 32.4 30.0 - 100.0 ng/mL    Comment: Vitamin D deficiency has been defined by the Institute of Medicine and an Endocrine Society practice guideline as a level of serum 25-OH vitamin D less than 20 ng/mL (1,2). The Endocrine Society went on to further define vitamin D insufficiency as a level between 21 and 29 ng/mL (2). 1. IOM (Institute of Medicine). 2010. Dietary reference    intakes for calcium and D. [place]: The    [employer]. 2. Holick MF, Binkley [place], Bischoff-Ferrari HA, et al.    Evaluation, treatment, and prevention of vitamin D    deficiency: an Endocrine Society clinical practice    guideline.  JCEM. 2011 Jul; 96(7):1911-30.   Lipid panel     Status: None   Collection Time: 01/23/23  8:26 AM  Result Value Ref Range  Cholesterol, Total 159 100 - 199 mg/dL   Triglycerides 86 0 - 149 mg/dL   HDL 73 >69 mg/dL   VLDL Cholesterol Cal 16 5 - 40 mg/dL   LDL Chol Calc (NIH) 70 0 - 99 mg/dL   Chol/HDL Ratio 2.2 0.0 - 4.4 ratio    Comment:                                   T. Chol/HDL Ratio                                             Men  Women                               1/2 Avg.Risk  3.4    3.3                                   Avg.Risk  5.0    4.4                                2X Avg.Risk  9.6    7.1                                3X Avg.Risk 23.4   11.0   CK total and CKMB (cardiac)not at [facility]     Status: None   Collection Time: 01/23/23  8:26 AM  Result Value Ref Range   Total CK 47 32 - 182 U/L   CK-MB Index 1.2 0.0 - 5.3 ng/mL  CMP14+EGFR     Status: None   Collection Time: 01/23/23  8:26 AM  Result Value Ref Range   Glucose 97 70 - 99 mg/dL   BUN 12 8 - 27 mg/dL   Creatinine, Ser 6.29 0.57 - 1.00 mg/dL   eGFR 94 >52 WU/XLK/4.40   BUN/Creatinine Ratio 17 12 - 28   Sodium 136 134 - 144 mmol/L   Potassium 4.1 3.5 - 5.2 mmol/L   Chloride 99 96 - 106 mmol/L   CO2 24 20 - 29 mmol/L   Calcium 10.1 8.7 - 10.3 mg/dL   Total Protein 6.2 6.0 - 8.5 g/dL   Albumin 4.2 3.9 - 4.9 g/dL   Globulin, Total 2.0 1.5 - 4.5 g/dL   Bilirubin Total 0.4 0.0 - 1.2 mg/dL   Alkaline Phosphatase 73 44 - 121 IU/L   AST 17 0 - 40 IU/L   ALT 18 0 - 32 IU/L  Lyme Disease Serology w/Reflex     Status: None   Collection Time: 01/24/23 11:47 AM  Result Value Ref Range   Lyme Total Antibody EIA Negative Negative    Comment: Lyme antibodies not detected. Reflex testing is not indicated. No laboratory evidence of infection with B. burgdorferi (Lyme disease). Negative results may occur in patients recently infected (less than or equal to 14 days) with B. burgdorferi.  If recent infection  is suspected, repeat testing on a new sample collected in 7 to 14 days is recommended.   ECHOCARDIOGRAM COMPLETE  Status: None   Collection Time: 02/20/23 11:07 AM  Result Value Ref Range   S' Lateral 2.94 cm   AV Area VTI 1.83 cm2   AV Mean grad 3.0 mmHg   AV Area mean vel 1.83 cm2   Area-P 1/2 3.99 cm2   AR max vel 1.88 cm2   AV Peak grad 6.2 mmHg   Ao pk vel 1.24 m/s   Est EF 60 - 65%          has a past medical history of Osteoporosis and Rosacea.   reports that she has never smoked. She has never used smokeless tobacco.  Past Surgical History:  Procedure Laterality Date   BREAST BIOPSY Left    1995   DENTAL SURGERY     GALLBLADDER SURGERY      Allergies  Allergen Reactions   Amoxicillin Rash   Penicillins Rash    Immunization History  Administered Date(s) Administered   Fluad Quad(high Dose 65+) 02/08/2020, 03/21/2022   Fluad Trivalent(High Dose 65+) 03/05/2023   Influenza,inj,Quad PF,6+ Mos 02/27/2018   Influenza-Unspecified 02/20/2021   Moderna SARS-COV2 Booster Vaccination 04/01/2020   Moderna Sars-Covid-2 Vaccination 08/31/2019, 09/28/2019   PNEUMOCOCCAL CONJUGATE-20 06/15/2021   Tdap 02/08/2020   Zoster Recombinant(Shingrix) 10/30/2018, 03/31/2019    Family History  Problem Relation Age of Onset   Hypertension Mother    Atrial fibrillation Mother      Current Outpatient Medications:    alendronate (FOSAMAX) 70 MG tablet, TAKE 1 TABLET ONCE A WEEK WITH A FULL GLASS OF WATER ON AN EMPTY STOMACH, Disp: 12 tablet, Rfl: 3   atenolol (TENORMIN) 25 MG tablet, Take 0.5 tablets (12.5 mg total) by mouth at bedtime., Disp: 90 tablet, Rfl: 3   Calcium Carbonate-Vit D-Min (CALCIUM 1200 PO), Take by mouth daily., Disp: , Rfl:    rosuvastatin (CRESTOR) 5 MG tablet, 3-5 times weekly as tolerated, Disp: 80 tablet, Rfl: 3   VITAMIN D PO, Take by mouth., Disp: , Rfl:       Objective:   Vitals:   03/18/23 1305  BP: 108/68  Pulse: 70  SpO2: 99%   Weight: 132 lb (59.9 kg)  Height: 5\' 7"  (1.702 m)    Estimated body mass index is 20.67 kg/m as calculated from the following:   Height as of this encounter: 5\' 7"  (1.702 m).   Weight as of this encounter: 132 lb (59.9 kg).  @WEIGHTCHANGE @  Filed Weights   03/18/23 1305  Weight: 132 lb (59.9 kg)     Physical Exam   General: No distress. Looks well O2 at rest: no Cane present: no Sitting in wheel chair: no Frail: no Obese: no Neuro: Alert and Oriented x 3. GCS 15. Speech normal Psych: Pleasant Resp:  Barrel Chest - no.  Wheeze - no, Crackles - no, No overt respiratory distress CVS: Normal heart sounds. Murmurs - no Ext: Stigmata of Connective Tissue Disease - no HEENT: Normal upper airway. PEERL +. No post nasal drip        Assessment:       ICD-10-CM   1. Left lower lobe pulmonary nodule  R91.1 CT Super D Chest Wo Contrast    QuantiFERON-TB Gold Plus    2. History of 2019 novel coronavirus disease (COVID-19)  Z86.16     3. Calcified granuloma of lung  J98.4 QuantiFERON-TB Gold Plus         Plan:     Patient Instructions     ICD-10-CM   1. Left lower lobe  pulmonary nodule  R91.1     2. History of 2019 novel coronavirus disease (COVID-19)  Z86.16     3. Calcified granuloma of lung  J98.4      There is a left upper lobe nodule that looks postinfectious post-COVID  Plan - Do blood test for QuantiFERON gold - Do CT scan of the chest without contrast to visit later part of December 2024 or early part of January 2025  -Do super D CT chest  Follow-up - Return to see Kandice Robinsons or any of the nurse practitioners after the CT scan to discuss results  -If it is growing you might need a biopsy procedure   FOLLOWUP Return in about 2 months (around 05/18/2023) for 15 min visit, with any of the APPS.    SIGNATURE    Dr. Kalman Bennett, M.D., F.C.C.P,  Pulmonary and Critical Care Medicine Staff Physician, [facility] Center Director -  Interstitial Lung Disease  Program  Pulmonary Fibrosis [facility] at [facility] [place], [place], [address]  Pager: [phone], If no answer or between  15:00h - 7:00h: call [phone] Telephone: [phone]  1:22 PM 03/18/2023

## 2023-03-20 LAB — QUANTIFERON-TB GOLD PLUS
Mitogen-NIL: >10 [IU]/mL
NIL: 0.04 [IU]/mL
QuantiFERON-TB Gold Plus: NEGATIVE
TB1-NIL: <0 [IU]/mL
TB2-NIL: <0 [IU]/mL

## 2023-03-28 ENCOUNTER — Other Ambulatory Visit: Payer: Self-pay | Admitting: Family Medicine

## 2023-03-28 DIAGNOSIS — Z1231 Encounter for screening mammogram for malignant neoplasm of breast: Secondary | ICD-10-CM

## 2023-04-15 NOTE — Progress Notes (Unsigned)
Cardiology Office Note:  .   Date:  04/16/2023  ID:  Melinda Bennett, DOB 06-27-1955, MRN [id number] PCP: Sonny Masters, FNP  [facility] Providers Cardiologist:  Reatha Harps, MD { History of Present Illness: Melinda Bennett Kitchen   Melinda Bennett is a 67 y.o. female with history of SVT, HLD who presents for the evaluation of SVT at the request of Rakes, Doralee Albino, FNP.  CT Chest 01/22/2023 -> No coronary calcium    History of Present Illness   Melinda Bennett, a 67 year old with a history of SVT and hyperlipidemia, presents for evaluation of her SVT. She recently wore a heart monitor that showed brief episodes of SVT lasting around ten beats. She reports that she had COVID-19 in August and started experiencing tachycardia in September. The tachycardia episodes were characterized by a racing heart rate, reaching up to 130 beats per minute, and were particularly noticeable at night, causing her to have difficulty sleeping. She denies experiencing chest pain but mentions feeling unwell and possibly short of breath during these episodes. She was seen in the ER, where she was ruled out for a Pulmonary Embolism (PE) but noted some changes in her lung, possibly due to COVID-19. She was started on a low dose of atenolol, which has helped alleviate her symptoms. She also has a history of bronchiectasis and is following with pulmonary.          Problem List SVT -brief episodes, longest duration 10 beats  2. HLD -T chol 159, HDL 73, LDL 70, TG 86 3. Bronchiectasis    ROS: All other ROS reviewed and negative. Pertinent positives noted in the HPI.     Studies Reviewed: Melinda Bennett Kitchen   EKG Interpretation Date/Time:  Tuesday April 16 2023 09:11:55 EST Ventricular Rate:  80 PR Interval:  122 QRS Duration:  84 QT Interval:  378 QTC Calculation: 435 R Axis:   59  Text Interpretation: Normal sinus rhythm Possible Left atrial enlargement Confirmed by Lennie Odor [phone]) on 04/16/2023 9:14:18 AM    TTE 02/20/2023  1. Left  ventricular ejection fraction, by estimation, is 60 to 65%. The  left ventricle has normal function. The left ventricle has no regional  wall motion abnormalities. Left ventricular diastolic parameters are  consistent with Grade I diastolic  dysfunction (impaired relaxation). The average left ventricular global  longitudinal strain is -19.7 %. The global longitudinal strain is normal.   2. Right ventricular systolic function is normal. The right ventricular  size is normal. There is normal pulmonary artery systolic pressure.   3. Left atrial size was mild to moderately dilated.   4. The mitral valve is normal in structure. Trivial mitral valve  regurgitation. No evidence of mitral stenosis.   5. The aortic valve is tricuspid. Aortic valve regurgitation is not  visualized. No aortic stenosis is present.   6. The inferior vena cava is normal in size with greater than 50%  respiratory variability, suggesting right atrial pressure of 3 mmHg.  Physical Exam:   VS:  BP (!) 110/56 (BP Location: Left Arm, Patient Position: Sitting, Cuff Size: Normal)   Pulse 80   Ht 5' 7.5" (1.715 m)   Wt 133 lb (60.3 kg)   BMI 20.52 kg/m    Wt Readings from Last 3 Encounters:  04/16/23 133 lb (60.3 kg)  03/18/23 132 lb (59.9 kg)  03/05/23 132 lb (59.9 kg)    GEN: Well nourished, well developed in no acute distress NECK: No JVD; No carotid bruits CARDIAC: RRR,  no murmurs, rubs, gallops RESPIRATORY:  Clear to auscultation without rales, wheezing or rhonchi  ABDOMEN: Soft, non-tender, non-distended EXTREMITIES:  No edema; No deformity  ASSESSMENT AND PLAN: .   Assessment and Plan    Supraventricular Tachycardia (SVT) Recent onset of brief SVT episodes, likely post-COVID. Symptoms have improved on low-dose Atenolol. EKG shows normal sinus rhythm. Echocardiogram is normal. -Continue Atenolol. -Plan to reassess in 6 months with consideration to stop Atenolol.  Hyperlipidemia Controlled on Rosuvastatin  three times a week. -Continue current regimen.  Bronchiectasis Recent CT scan showed changes possibly related to COVID. Plan for repeat scan in 6 months. -Follow-up with pulmonologist as planned.              Follow-up: Return in about 6 months (around 10/15/2023).  Signed, Lenna Gilford. Flora Lipps, MD, [facility]  [facility]  [address], [address] [place], [place] [address] [phone]  9:32 AM

## 2023-04-16 ENCOUNTER — Encounter: Payer: Self-pay | Admitting: Cardiovascular Disease

## 2023-04-16 ENCOUNTER — Ambulatory Visit: Payer: Medicare Other | Attending: Cardiovascular Disease | Admitting: Cardiovascular Disease

## 2023-04-16 VITALS — BP 110/56 | HR 80 | Ht 67.5 in | Wt 133.0 lb

## 2023-04-16 DIAGNOSIS — R002 Palpitations: Secondary | ICD-10-CM | POA: Diagnosis not present

## 2023-04-16 DIAGNOSIS — I471 Supraventricular tachycardia, unspecified: Secondary | ICD-10-CM | POA: Diagnosis not present

## 2023-04-16 NOTE — Patient Instructions (Addendum)
Medication Instructions:  Your physician recommends that you continue on your current medications as directed. Please refer to the Current Medication list given to you today.    *If you need a refill on your cardiac medications before your next appointment, please call your pharmacy*   Lab Work: NONE    If you have labs (blood work) drawn today and your tests are completely normal, you will receive your results only by: MyChart Message (if you have MyChart) OR A paper copy in the mail If you have any lab test that is abnormal or we need to change your treatment, we will call you to review the results.   Testing/Procedures: NONE   Follow-Up: At [facility], you and your health needs are our priority.  As part of our continuing mission to provide you with exceptional heart care, we have created designated Provider Care Teams.  These Care Teams include your primary Cardiologist (physician) and Advanced Practice Providers (APPs -  Physician Assistants and Nurse Practitioners) who all work together to provide you with the care you need, when you need it.  We recommend signing up for the patient portal called "MyChart".  Sign up information is provided on this After Visit Summary.  MyChart is used to connect with patients for Virtual Visits (Telemedicine).  Patients are able to view lab/test results, encounter notes, upcoming appointments, etc.  Non-urgent messages can be sent to your provider as well.   To learn more about what you can do with MyChart, go to [email or web address].    Your next appointment:   6 month(s)  The format for your next appointment:   In Person  Provider:   Reatha Harps, MD    Other Instructions

## 2023-04-22 ENCOUNTER — Ambulatory Visit
Admission: RE | Admit: 2023-04-22 | Discharge: 2023-04-22 | Disposition: A | Discharge status: Home / Self Care | Payer: Medicare Other | Source: Ambulatory Visit | Attending: Family Medicine | Admitting: Family Medicine

## 2023-04-22 DIAGNOSIS — Z1231 Encounter for screening mammogram for malignant neoplasm of breast: Secondary | ICD-10-CM | POA: Diagnosis not present

## 2023-04-25 ENCOUNTER — Encounter: Payer: Self-pay | Admitting: Family Medicine

## 2023-05-13 ENCOUNTER — Ambulatory Visit
Admission: RE | Admit: 2023-05-13 | Discharge: 2023-05-13 | Disposition: A | Discharge status: Home / Self Care | Payer: Medicare Other | Source: Ambulatory Visit | Attending: Internal Medicine | Admitting: Internal Medicine

## 2023-05-13 DIAGNOSIS — R911 Solitary pulmonary nodule: Secondary | ICD-10-CM

## 2023-05-13 DIAGNOSIS — R918 Other nonspecific abnormal finding of lung field: Secondary | ICD-10-CM | POA: Diagnosis not present

## 2023-05-21 ENCOUNTER — Ambulatory Visit: Payer: Medicare Other | Admitting: Nurse Practitioner

## 2023-05-27 DIAGNOSIS — X32XXXD Exposure to sunlight, subsequent encounter: Secondary | ICD-10-CM | POA: Diagnosis not present

## 2023-05-27 DIAGNOSIS — L570 Actinic keratosis: Secondary | ICD-10-CM | POA: Diagnosis not present

## 2023-06-04 ENCOUNTER — Telehealth: Payer: Medicare Other | Admitting: Acute Care

## 2023-06-11 ENCOUNTER — Ambulatory Visit: Payer: Medicare Other | Admitting: Acute Care

## 2023-06-13 ENCOUNTER — Encounter: Payer: Self-pay | Admitting: Acute Care

## 2023-06-13 ENCOUNTER — Ambulatory Visit (INDEPENDENT_AMBULATORY_CARE_PROVIDER_SITE_OTHER): Payer: Medicare Other | Admitting: Acute Care

## 2023-06-13 VITALS — BP 100/58 | HR 76 | Ht 67.0 in | Wt 133.0 lb

## 2023-06-13 DIAGNOSIS — R918 Other nonspecific abnormal finding of lung field: Secondary | ICD-10-CM

## 2023-06-13 DIAGNOSIS — J479 Bronchiectasis, uncomplicated: Secondary | ICD-10-CM

## 2023-06-13 NOTE — Progress Notes (Addendum)
History of Present Illness Melinda Bennett is a 68 y.o. female never smoker with incidental finding of a lung nodule. She was seen by Dr. Marchelle Gearing for a lung nodule consult. Pt will be followed by Dr. Delton Coombes.   Synopsis 68 y.o. female - Recent history Had covid in August 2024, this was her first COVID.-emergency room visit on 01/22/2023 because of COVID the month prior in August 2024 and was also experiencing fatigue for 2 weeks.  She mentioned her shortness of breath while in the ED and also has a history of amoxicillin prophylaxis for dental implant.  In the ED on 01/22/2023 pulm embolism was ruled out but CT angiogram chest showed nodule . She was seen by Dr. Marchelle Gearing 03/17/2024, and he ordered QuantiFERON gold blood work, Super D CT scan of the chest without contrast to be done  December 2024 or early part of January 2025, then follow up in the pulmonary office to review results.              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  This puts her at risk for histoplasmosis infectious calcified granuloma - She lived in [place] for 20 years in the 1996 moved to [place] and recently has moved to [place] - No active smoking history - No radon exposure. - She did grow up [place] a farm, in [place], and she now lives on a farm and is around cows. 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 Presents for follow up after Super D CT Chest done to reevaluate an incidental finding of a lung nodule done in the emergency room September 2024.  Of note she was negative for pulmonary embolism in September 2024, and her QuantiFERON gold is negative when it was drawn in November 2024. . We have reviewed the super D CT chest that Dr. Marchelle Gearing ordered.  It shows a  stable CT of the chest with some waxing waning lung nodules that are most likely infectious or inflammatory.  There was notation of bronchiectasis on this CT scan.  Patient states that she does cough but is unable to mobilize secretions. She has a sensation of secretions moving in her chest when she lays on her side.  Plan will be to treat the bronchiectasis with Mucinex daily and a flutter valve to help mobilize secretions. We will do a 42-month follow-up CT of the chest to ensure stability of pulmonary nodules We will culture secretions once patient is actually able to mobilize them.  Patient is in agreement with the above plan.  38-month follow-up CT chest will be due at the end of March 2025  Test Results: Super D CT Chest 05/13/2023 Lungs/Pleura: Biapical pleuroparenchymal scarring evident. Bilateral pulmonary nodules are again identified. 15 x 7 mm superior segment left lower lobe nodule on 41/2 was 16 x 11 mm previously. Tree-in-bud nodularity paraspinal left lower lobe on image 67/2 is new in the interval. Dominant nodule in this cluster measures 5 mm on image 67/2.   Tree-in-bud nodularity with areas of peripheral airway impaction are seen in the posterior left upper lobe with new elongated nodular opacity measuring 14 x 5 mm on 67/2. Adjacent 5 mm posterior left upper lobe nodule on 63/2 is  similar to prior.   Lingular scarring with associated bronchiectasis is similar to prior.  Waxing and waning of bilateral pulmonary nodules with new tree-in-bud nodularity in the paraspinal left lower lobe and posterior left upper lobe. Some of these regions have associated peripheral airway impaction. Imaging features are compatible with an infectious/inflammatory process and atypical etiology would be a distinct consideration. Dominant new nodule today measures 14 x 5 mm. Non-contrast chest CT at 3-6 months is recommended.   CT Chest 03/17/2024 Lungs/Pleura: Multiple right lung calcified  granulomata. Mild biapical pleural and parenchymal scarring. Posterior lingular cylindrical and cystic bronchiectasis associated parenchymal densities.   4 mm left upper lobe nodule on image number 65/6, better seen on sagittal image number 112/8.   Larger, more linearly shaped nodular density more superiorly in the left lower lobe superior segment. Superiorly, this appears to represent a confluence of smaller nodular densities. This measures 17 x 8 mm on sagittal image number 113/8 and 20 mm in width on coronal image number 29/7.   2 mm subpleural nodule in the 2 mm peripheral nodule in the lateral left upper lobe on image number 59/6. Similar sized nodule more anteriorly in the lateral left upper lobe on that image.  Multiple left lung nodules, the largest measuring 17 x 8 x 20 mm. These are most likely infectious or postinfectious in nature. However, malignancy is not excluded. Non-contrast chest CT at 3-6 months is recommended.      Latest Ref Rng & Units 01/22/2023    2:10 PM 06/18/2022    9:49 AM 06/15/2021    3:14 PM  CBC  WBC 4.0 - 10.5 K/uL 9.4  5.1  6.9   Hemoglobin 12.0 - 15.0 g/dL 81.1  91.4  78.2   Hematocrit 36.0 - 46.0 % 38.7  40.5  41.8   Platelets 150 - 400 K/uL 250  261  266        Latest Ref Rng & Units 01/23/2023    8:26 AM 01/22/2023    2:10 PM 06/18/2022    9:49 AM  BMP  Glucose 70 - 99 mg/dL 97  956  87   BUN 8 - 27 mg/dL 12  18  13    Creatinine 0.57 - 1.00 mg/dL 2.13  0.86  5.78   BUN/Creat Ratio 12 - 28 17   18    Sodium 134 - 144 mmol/L 136  137  141   Potassium 3.5 - 5.2 mmol/L 4.1  3.5  4.7   Chloride 96 - 106 mmol/L 99  102  103   CO2 20 - 29 mmol/L 24  27  26    Calcium 8.7 - 10.3 mg/dL 46.9  9.9  62.9     BNP No results found for: "BNP"  ProBNP No results found for: "PROBNP"  PFT No results found for: "FEV1PRE", "FEV1POST", "FVCPRE", "FVCPOST", "TLC", "DLCOUNC", "PREFEV1FVCRT", "PSTFEV1FVCRT"  No results found.   Past medical  hx Past Medical History:  Diagnosis Date   Hyperlipidemia    Osteoporosis    Rosacea      Social History   Tobacco Use   Smoking status: Never   Smokeless tobacco: Never  Vaping Use   Vaping status: Never Used  Substance Use Topics   Alcohol use: Never   Drug use: Never    Ms.Jankowiak reports that she has never smoked. She has never used smokeless tobacco. She reports that she does not drink alcohol and does not use drugs.  Tobacco Cessation: Never smoker   Past  surgical hx, Family hx, Social hx all reviewed.  Current Outpatient Medications on File Prior to Visit  Medication Sig   alendronate (FOSAMAX) 70 MG tablet TAKE 1 TABLET ONCE A WEEK WITH A FULL GLASS OF WATER ON AN EMPTY STOMACH   atenolol (TENORMIN) 25 MG tablet Take 0.5 tablets (12.5 mg total) by mouth at bedtime.   Calcium Carbonate-Vit D-Min (CALCIUM 1200 PO) Take by mouth daily.   rosuvastatin (CRESTOR) 5 MG tablet 3-5 times weekly as tolerated   VITAMIN D PO Take by mouth.   No current facility-administered medications on file prior to visit.     Allergies  Allergen Reactions   Amoxicillin Rash   Penicillins Rash    Review Of Systems:  Constitutional:   No  weight loss, night sweats,  Fevers, chills, +fatigue, or  lassitude.  HEENT:   No headaches,  Difficulty swallowing,  Tooth/dental problems, or  Sore throat,                No sneezing, itching, ear ache, nasal congestion, post nasal drip,   CV:  No chest pain,  Orthopnea, PND, swelling in lower extremities, anasarca, dizziness,+ palpitations, syncope.   GI  No heartburn, indigestion, abdominal pain, nausea, vomiting, diarrhea, change in bowel habits, loss of appetite, bloody stools.   Resp: No shortness of breath with exertion or at rest.  No excess mucus, no productive cough,  + non-productive cough,  No coughing up of blood.  No change in color of mucus.  No wheezing.  No chest wall deformity  Skin: no rash or lesions.  GU: no dysuria,  change in color of urine, no urgency or frequency.  No flank pain, no hematuria   MS:  No joint pain or swelling.  No decreased range of motion.  No back pain.  Psych:  No change in mood or affect. No depression or anxiety.  No memory loss.   Vital Signs BP (!) 100/58   Pulse 76   Ht 5\' 7"  (1.702 m)   Wt 133 lb (60.3 kg)   SpO2 96%   BMI 20.83 kg/m    Physical Exam:  General- No distress,  A&Ox3 ENT: No sinus tenderness, TM clear, pale nasal mucosa, no oral exudate,no post nasal drip, no LAN Cardiac: S1, S2, regular rate and rhythm, no murmur Chest: No wheeze/ rales/ dullness; no accessory muscle use, no nasal flaring, no sternal retractions Abd.: Soft Non-tender Ext: No clubbing cyanosis, edema Neuro:  normal strength Skin: No rashes, warm and dry Psych: normal mood and behavior   Assessment/Plan Incidentally found pulmonary nodule on ED visit and a non-smoker 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  This puts her at risk for histoplasmosis infectious calcified granuloma CT chest is stable, and shows some waxing and waning lung nodules, compatible with an infectious , inflammatory process.  Bronchiectasis Plan Your CT chest is stable, and shows some waxing and waning lung nodules, compatible with an infectious , inflammatory process.  Recommendation is for 3 month follow up CT Chest, which will be due end of April 2025.  We will also treat the bronchiectasis. Take 1200 mg of plain Mucinex with a full glass of water daily. Use flutter valve several times daily to help mobilize secretions. Use as needed for chest congestion, and secretion mobilization, with Mucinex. Blow into flutter valve 4-5 times , several times daily Follow up in 3 months after  CT chest.to review the results. We will send you with specimen cups. If cough becomes productive, please bring specimen for culture. We will send  you with instructions. Call if you need [place] sooner.  Please contact office for sooner follow up if symptoms do not improve or worsen or seek emergency care    I spent 30 minutes dedicated to the care of this patient on the date of this encounter to include pre-visit review of records, face-to-face time with the patient discussing conditions above, post visit ordering of testing, clinical documentation with the electronic health record, making appropriate referrals as documented, and communicating necessary information to the patient's healthcare team.    Bevelyn Ngo, NP 06/13/2023  9:08 AM

## 2023-06-13 NOTE — Patient Instructions (Addendum)
It is good to see you today. Your CT chest is stable, and shows some waxing and waning lung nodules, compatible with an infectious , inflammatory process.  Recommendation is for 3 month follow up CT Chest, which will be due end of April 2025.  We will also treat the bronchiectasis. Take 1200 mg of plain Mucinex with a full glass of water daily. Use flutter valve several times daily to help mobilize secretions. Use as needed for chest congestion, and secretion mobilization, with Mucinex. Blow into flutter valve 4-5 times , several times daily Follow up in 3 months after CT chest.to review the results. We will send you with specimen cups. If cough becomes productive, please bring specimen for culture. We will send you with instructions. Call if you need [place] sooner.  Please contact office for sooner follow up if symptoms do not improve or worsen or seek emergency care

## 2023-06-16 ENCOUNTER — Encounter: Payer: Self-pay | Admitting: Acute Care

## 2023-06-20 ENCOUNTER — Ambulatory Visit (INDEPENDENT_AMBULATORY_CARE_PROVIDER_SITE_OTHER): Payer: Medicare Other

## 2023-06-20 ENCOUNTER — Encounter: Payer: Medicare Other | Admitting: Family Medicine

## 2023-06-20 ENCOUNTER — Encounter: Payer: Self-pay | Admitting: Family Medicine

## 2023-06-20 ENCOUNTER — Ambulatory Visit: Payer: Medicare Other | Admitting: Family Medicine

## 2023-06-20 VITALS — BP 110/64 | HR 62 | Temp 96.9°F | Ht 67.0 in | Wt 134.6 lb

## 2023-06-20 DIAGNOSIS — E782 Mixed hyperlipidemia: Secondary | ICD-10-CM

## 2023-06-20 DIAGNOSIS — J479 Bronchiectasis, uncomplicated: Secondary | ICD-10-CM | POA: Diagnosis not present

## 2023-06-20 DIAGNOSIS — E559 Vitamin D deficiency, unspecified: Secondary | ICD-10-CM

## 2023-06-20 DIAGNOSIS — N3946 Mixed incontinence: Secondary | ICD-10-CM

## 2023-06-20 DIAGNOSIS — Z1329 Encounter for screening for other suspected endocrine disorder: Secondary | ICD-10-CM | POA: Diagnosis not present

## 2023-06-20 DIAGNOSIS — Z780 Asymptomatic menopausal state: Secondary | ICD-10-CM

## 2023-06-20 DIAGNOSIS — M810 Age-related osteoporosis without current pathological fracture: Secondary | ICD-10-CM

## 2023-06-20 MED ORDER — ROSUVASTATIN CALCIUM 5 MG PO TABS: ORAL_TABLET | ORAL | 3 refills | Status: AC

## 2023-06-20 NOTE — Progress Notes (Addendum)
 Complete physical exam  Patient: Melinda Bennett   DOB: 12-09-1955   68 y.o. Female  MRN: [id number]  Subjective:    Chief Complaint  Patient presents with   Establish Care    Previous dettinger patient    Annual Exam    Melinda Bennett is a 68 y.o. female who presents today for a complete physical exam. She reports consuming a general diet.  Patient is very active daily.  She generally feels well. She reports sleeping well. She does not have additional problems to discuss today.  Discussed the use of AI scribe software for clinical note transcription with the patient, who gave verbal consent to proceed.  History of Present Illness   Melinda Bennett is a 68 year old female who presents for an annual physical exam.  She has been experiencing ongoing post-COVID symptoms since mid-August, including episodes of tachycardia. In mid-September, she visited the emergency room due to tachycardia and was subsequently prescribed atenolol  12.5 mg. She attempted to discontinue the medication after three months but experienced rebound tachycardia and resumed the medication after three days. Her heart rate reaches 110 bpm when standing, and she describes a sensation of 'boom, boom, boom' at night without the medication. No significant fatigue from atenolol  use.  She underwent a second CT scan of her lungs, which revealed persistent nodules and mucus plugs, attributed to histoplasmosis from her time living in the [place]. She has been using Mucinex 1200 mg daily for a week, drinking plenty of water, and using a flutter valve, but reports minimal cough and difficulty expectorating.  She has been experiencing urinary incontinence for over a year and a half, with symptoms worsening. She describes it as urge incontinence, needing to 'hold herself' to prevent leakage, especially at night or when coughing. She has a history of a fourth-degree tear during childbirth, which she suspects may contribute to her symptoms. She has  not tried pelvic floor physical therapy.  She has a history of osteoporosis and has been on Fosamax  for two years. She takes vitamin D  and calcium  supplements. She also takes Crestor  three times a week to manage cholesterol, as more frequent dosing leads to headaches. No recent fractures.  Her last colonoscopy was over ten years ago, but she has had two normal Cologuard tests since then. She had a mammogram in December and received a flu shot this year.       Most recent fall risk assessment:    02/05/2023    2:49 PM  Fall Risk   Falls in the past year? 0     Most recent depression screenings:    02/05/2023    2:49 PM 06/18/2022    9:18 AM  PHQ 2/9 Scores  PHQ - 2 Score 0 0  PHQ- 9 Score 0     Vision:Within last year and Dental: No current dental problems and Receives regular dental care  Patient Active Problem List   Diagnosis Date Noted   Vitamin D  deficiency 06/20/2023   Bronchiectasis without complication (HCC) 06/20/2023   Post-menopausal 06/20/2023   Hyperlipidemia 06/18/2022   Bladder incontinence 06/18/2022   Osteoporosis 01/16/2019   Past Medical History:  Diagnosis Date   Hyperlipidemia    Osteoporosis    Rosacea    Past Surgical History:  Procedure Laterality Date   BREAST BIOPSY Left    1995   DENTAL SURGERY     GALLBLADDER SURGERY     Social History   Tobacco Use   Smoking status: Never  Smokeless tobacco: Never  Vaping Use   Vaping status: Never Used  Substance Use Topics   Alcohol use: Never   Drug use: Never   Social History   Socioeconomic History   Marital status: Married    Spouse name: Programme Researcher, Broadcasting/film/video   Number of children: 4   Years of education: 16   Highest education level: Master's degree (e.g., MA, MS, MEng, MEd, MSW, MBA)  Occupational History   Not on file  Tobacco Use   Smoking status: Never   Smokeless tobacco: Never  Vaping Use   Vaping status: Never Used  Substance and Sexual Activity   Alcohol use: Never   Drug use: Never    Sexual activity: Yes    Comment: married  since 1982, 4 children all grown  Other Topics Concern   Not on file  Social History Narrative   Not on file   Social Drivers of Health   Financial Resource Strain: Low Risk  (06/19/2023)   Overall Financial Resource Strain (CARDIA)    Difficulty of Paying Living Expenses: Not hard at all  Food Insecurity: No Food Insecurity (06/19/2023)   Hunger Vital Sign    Worried About Running Out of Food in the Last Year: Never true    Ran Out of Food in the Last Year: Never true  Transportation Needs: No Transportation Needs (06/19/2023)   PRAPARE - Administrator, Civil Service (Medical): No    Lack of Transportation (Non-Medical): No  Physical Activity: Unknown (06/19/2023)   Exercise Vital Sign    Days of Exercise per Week: 3 days    Minutes of Exercise per Session: Not on file  Stress: No Stress Concern Present (06/19/2023)   [employer] of Occupational Health - Occupational Stress Questionnaire    Feeling of Stress : Not at all  Social Connections: Socially Integrated (06/19/2023)   Social Connection and Isolation Panel [NHANES]    Frequency of Communication with Friends and Family: More than three times a week    Frequency of Social Gatherings with Friends and Family: Not on file    Attends Religious Services: More than 4 times per year    Active Member of Clubs or Organizations: Yes    Attends Banker Meetings: More than 4 times per year    Marital Status: Married  Catering Manager Violence: Unknown (08/17/2021)   Received from [employer], [facility]   HITS    Physically Hurt: Not on file    Insult or Talk Down To: Not on file    Threaten Physical Harm: Not on file    Scream or Curse: Not on file   Family Status  Relation Name Status   Mother  Alive   Father  Deceased   Neg Hx  (Not Specified)  No partnership data on file   Family History  Problem Relation Age of Onset   Hypertension Mother    Atrial  fibrillation Mother    Breast cancer Neg Hx    Allergies  Allergen Reactions   Amoxicillin Rash   Penicillins Rash      Patient Care Team: Severa Rock HERO, FNP as PCP - General (Family Medicine) O'Neal, Darryle Ned, MD as PCP - Cardiology (Cardiology) Billee Mliss BIRCH, [facility] as Pharmacist (Family Medicine)   Outpatient Medications Prior to Visit  Medication Sig   alendronate  (FOSAMAX ) 70 MG tablet TAKE 1 TABLET ONCE A WEEK WITH A FULL GLASS OF WATER ON AN EMPTY STOMACH   atenolol  (TENORMIN ) 25  MG tablet Take 0.5 tablets (12.5 mg total) by mouth at bedtime.   Calcium  Carbonate-Vit D-Min (CALCIUM  1200 PO) Take by mouth daily.   VITAMIN D  PO Take by mouth.   [DISCONTINUED] rosuvastatin  (CRESTOR ) 5 MG tablet 3-5 times weekly as tolerated   No facility-administered medications prior to visit.    ROS per HPI     Objective:     BP 110/64   Pulse 62   Temp (!) 96.9 F (36.1 C)   Ht 5' 7 (1.702 m)   Wt 134 lb 9.6 oz (61.1 kg)   SpO2 98%   BMI 21.08 kg/m  BP Readings from Last 3 Encounters:  06/20/23 110/64  06/13/23 (!) 100/58  04/16/23 (!) 110/56   Wt Readings from Last 3 Encounters:  06/20/23 134 lb 9.6 oz (61.1 kg)  06/13/23 133 lb (60.3 kg)  04/16/23 133 lb (60.3 kg)   SpO2 Readings from Last 3 Encounters:  06/20/23 98%  06/13/23 96%  03/18/23 99%      Physical Exam Vitals and nursing note reviewed.  Constitutional:      General: She is not in acute distress.    Appearance: Normal appearance. She is not ill-appearing, toxic-appearing or diaphoretic.  HENT:     Head: Normocephalic and atraumatic.     Right Ear: Tympanic membrane, ear canal and external ear normal.     Left Ear: Tympanic membrane, ear canal and external ear normal.     Nose: Nose normal.     Mouth/Throat:     Mouth: Mucous membranes are moist.     Pharynx: Oropharynx is clear.  Eyes:     Extraocular Movements: Extraocular movements intact.     Conjunctiva/sclera: Conjunctivae normal.      Pupils: Pupils are equal, round, and reactive to light.  Cardiovascular:     Rate and Rhythm: Normal rate and regular rhythm.     Heart sounds: Normal heart sounds.  Pulmonary:     Effort: Pulmonary effort is normal.     Breath sounds: Normal breath sounds.  Abdominal:     General: Bowel sounds are normal.     Palpations: Abdomen is soft.  Musculoskeletal:     Cervical back: Normal range of motion and neck supple.     Right lower leg: No edema.     Left lower leg: No edema.  Skin:    General: Skin is warm and dry.     Capillary Refill: Capillary refill takes less than 2 seconds.  Neurological:     General: No focal deficit present.     Mental Status: She is alert and oriented to person, place, and time.  Psychiatric:        Mood and Affect: Mood normal.        Behavior: Behavior normal.        Thought Content: Thought content normal.        Judgment: Judgment normal.       Last CBC Lab Results  Component Value Date   WBC 9.4 01/22/2023   HGB 12.2 01/22/2023   HCT 38.7 01/22/2023   MCV 90.0 01/22/2023   MCH 28.4 01/22/2023   RDW 13.2 01/22/2023   PLT 250 01/22/2023   Last metabolic panel Lab Results  Component Value Date   GLUCOSE 97 01/23/2023   NA 136 01/23/2023   K 4.1 01/23/2023   CL 99 01/23/2023   CO2 24 01/23/2023   BUN 12 01/23/2023   CREATININE 0.69 01/23/2023   EGFR 94 01/23/2023  CALCIUM  10.1 01/23/2023   PROT 6.2 01/23/2023   ALBUMIN 4.2 01/23/2023   LABGLOB 2.0 01/23/2023   AGRATIO 2.1 06/18/2022   BILITOT 0.4 01/23/2023   ALKPHOS 73 01/23/2023   AST 17 01/23/2023   ALT 18 01/23/2023   ANIONGAP 8 01/22/2023   Last lipids Lab Results  Component Value Date   CHOL 159 01/23/2023   HDL 73 01/23/2023   LDLCALC 70 01/23/2023   TRIG 86 01/23/2023   CHOLHDL 2.2 01/23/2023   Last hemoglobin A1c Lab Results  Component Value Date   HGBA1C 5.4 01/23/2023   Last thyroid  functions Lab Results  Component Value Date   TSH 2.210  01/23/2023   T4TOTAL 6.2 01/23/2023   Last vitamin D  Lab Results  Component Value Date   VD25OH 32.4 01/23/2023      Assessment & Plan:    Routine Health Maintenance and Physical Exam  Immunization History  Administered Date(s) Administered   Fluad Quad(high Dose 65+) 02/08/2020, 03/21/2022   Fluad Trivalent(High Dose 65+) 03/05/2023   Influenza,inj,Quad PF,6+ Mos 02/27/2018   Influenza-Unspecified 02/20/2021   Moderna SARS-COV2 Booster Vaccination 04/01/2020   Moderna Sars-Covid-2 Vaccination 08/31/2019, 09/28/2019   PNEUMOCOCCAL CONJUGATE-20 06/15/2021   Tdap 02/08/2020   Zoster Recombinant(Shingrix ) 10/30/2018, 03/31/2019    Health Maintenance  Topic Date Due   Medicare Annual Wellness (AWV)  11/01/2020   COVID-19 Vaccine (4 - 2024-25 season) 07/06/2023 (Originally 01/13/2023)   DEXA SCAN  08/11/2023   Fecal DNA (Cologuard)  11/27/2024   MAMMOGRAM  04/21/2025   DTaP/Tdap/Td (2 - Td or Tdap) 02/07/2030   Pneumonia Vaccine 64+ Years old  Completed   INFLUENZA VACCINE  Completed   Hepatitis C Screening  Completed   Zoster Vaccines- Shingrix   Completed   HPV VACCINES  Aged Out    Discussed health benefits of physical activity, and encouraged her to engage in regular exercise appropriate for her age and condition.  Problem List Items Addressed This Visit       Respiratory   Bronchiectasis without complication (HCC)   Relevant Orders   CBC with Differential/Platelet     Musculoskeletal and Integument   Osteoporosis   Relevant Orders   CMP14+EGFR   VITAMIN D  25 Hydroxy (Vit-D Deficiency, Fractures)   DG [facility] DEXA     Other   Hyperlipidemia - Primary   Relevant Medications   rosuvastatin  (CRESTOR ) 5 MG tablet   Other Relevant Orders   Lipid panel   CBC with Differential/Platelet   CMP14+EGFR   Bladder incontinence   Relevant Orders   Ambulatory referral to Urology   Vitamin D  deficiency   Relevant Orders   CMP14+EGFR   VITAMIN D  25 Hydroxy (Vit-D  Deficiency, Fractures)   Post-menopausal   Relevant Orders   CMP14+EGFR   Thyroid  Panel With TSH   DG [facility] DEXA   Other Visit Diagnoses       Screening for endocrine disorder       Relevant Orders   CMP14+EGFR   Thyroid  Panel With TSH     Assessment and Plan    Post-COVID Tachycardia Experienced tachycardia post-COVID in mid-September. Currently on 12.5 mg atenolol  daily. Abrupt discontinuation led to rebound tachycardia. Discussed tapering off beta blockers to avoid rebound effects. Patient prefers to avoid long-term medication. - Taper atenolol : every other day for a week, then three times a week, then stop. - Consider 'pill in the pocket' approach for episodic tachycardia post-discontinuation.  Pulmonary Nodules and Bronchiectasis Pulmonary nodules and bronchiectasis, likely secondary to histoplasmosis.  Recent CT showed improvement but new areas of concern. Currently on Mucinex 1200 mg daily, high water intake, and using a flutter valve but not producing sputum. Discussed increasing Mucinex to potentially produce sputum for culture. - Increase Mucinex to 1200 mg twice daily. - Continue high water intake. - If sputum is produced, bring it in for culture.  Urinary Incontinence Urge incontinence worsened over the past year and a half. History of fourth-degree perineal tear during childbirth. Symptoms include nocturia and stress incontinence. Prefers urology consultation over pelvic floor physical therapy. - Refer to urology for further evaluation and management.  Osteoporosis On Fosamax  for two years. Discussed typical treatment duration of three to five years followed by a drug holiday. Mentioned alternatives like Prolia and Evenity. Patient prefers to continue Fosamax . - Order DEXA scan. - Continue Fosamax . - Evaluate bone density results to consider switching to Prolia if necessary. - Ensure adequate calcium  and vitamin D  intake.  Hyperlipidemia On Crestor  three times a  week, effective in lowering LDL levels. Reports muscle aches with increased frequency of dosing. Prefers current regimen due to tolerability. - Continue Crestor  three times a week. - Monitor lipid levels.  General Health Maintenance Up to date on mammogram (December 2024) and Cologuard (due in 2026). Discussed importance of continuing  - Ensure regular mammograms. - Continue with Cologuard screening, next due in 2026.  Follow-up - Follow up with urology if no contact within two weeks. - Monitor response to increased Mucinex dosage. - Review DEXA scan results and adjust osteoporosis treatment if necessary. - Regular follow-up for hyperlipidemia management.     Total time spent with patient 40 minutes.    Return in about 1 year (around 06/19/2024), or if symptoms worsen or fail to improve, for Annual Physical.     Rosaline Bruns, FNP

## 2023-06-21 ENCOUNTER — Encounter: Payer: Self-pay | Admitting: Family Medicine

## 2023-06-21 DIAGNOSIS — Z780 Asymptomatic menopausal state: Secondary | ICD-10-CM | POA: Diagnosis not present

## 2023-06-21 DIAGNOSIS — M810 Age-related osteoporosis without current pathological fracture: Secondary | ICD-10-CM | POA: Diagnosis not present

## 2023-06-21 LAB — CBC WITH DIFFERENTIAL/PLATELET
Basophils Absolute: 0 10*3/uL (ref 0.0–0.2)
Basos: 1 %
EOS (ABSOLUTE): 0.1 10*3/uL (ref 0.0–0.4)
Eos: 2 %
Hematocrit: 38.9 % (ref 34.0–46.6)
Hemoglobin: 12.9 g/dL (ref 11.1–15.9)
Immature Grans (Abs): 0 10*3/uL (ref 0.0–0.1)
Immature Granulocytes: 0 %
Lymphocytes Absolute: 1.7 10*3/uL (ref 0.7–3.1)
Lymphs: 35 %
MCH: 28.5 pg (ref 26.6–33.0)
MCHC: 33.2 g/dL (ref 31.5–35.7)
MCV: 86 fL (ref 79–97)
Monocytes Absolute: 0.5 10*3/uL (ref 0.1–0.9)
Monocytes: 10 %
Neutrophils Absolute: 2.5 10*3/uL (ref 1.4–7.0)
Neutrophils: 52 %
Platelets: 257 10*3/uL (ref 150–450)
RBC: 4.52 x10E6/uL (ref 3.77–5.28)
RDW: 12 % (ref 11.7–15.4)
WBC: 4.8 10*3/uL (ref 3.4–10.8)

## 2023-06-21 LAB — LIPID PANEL
Chol/HDL Ratio: 2.4 {ratio} (ref 0.0–4.4)
Cholesterol, Total: 194 mg/dL (ref 100–199)
HDL: 81 mg/dL (ref >39)
LDL Chol Calc (NIH): 102 mg/dL — ABNORMAL HIGH (ref 0–99)
Triglycerides: 59 mg/dL (ref 0–149)
VLDL Cholesterol Cal: 11 mg/dL (ref 5–40)

## 2023-06-21 LAB — CMP14+EGFR
ALT: 20 [IU]/L (ref 0–32)
AST: 26 [IU]/L (ref 0–40)
Albumin: 4.5 g/dL (ref 3.9–4.9)
Alkaline Phosphatase: 66 [IU]/L (ref 44–121)
BUN/Creatinine Ratio: 21 (ref 12–28)
BUN: 16 mg/dL (ref 8–27)
Bilirubin Total: 0.5 mg/dL (ref 0.0–1.2)
CO2: 25 mmol/L (ref 20–29)
Calcium: 10.5 mg/dL — ABNORMAL HIGH (ref 8.7–10.3)
Chloride: 103 mmol/L (ref 96–106)
Creatinine, Ser: 0.75 mg/dL (ref 0.57–1.00)
Globulin, Total: 2 g/dL (ref 1.5–4.5)
Glucose: 80 mg/dL (ref 70–99)
Potassium: 4.3 mmol/L (ref 3.5–5.2)
Sodium: 141 mmol/L (ref 134–144)
Total Protein: 6.5 g/dL (ref 6.0–8.5)
eGFR: 86 mL/min/{1.73_m2} (ref >59)

## 2023-06-21 LAB — THYROID PANEL WITH TSH
Free Thyroxine Index: 1.6 (ref 1.2–4.9)
T3 Uptake Ratio: 28 % (ref 24–39)
T4, Total: 5.8 ug/dL (ref 4.5–12.0)
TSH: 2.64 u[IU]/mL (ref 0.450–4.500)

## 2023-06-21 LAB — VITAMIN D 25 HYDROXY (VIT D DEFICIENCY, FRACTURES): Vit D, 25-Hydroxy: 37.7 ng/mL (ref 30.0–100.0)

## 2023-07-08 ENCOUNTER — Other Ambulatory Visit: Payer: Self-pay | Admitting: Family Medicine

## 2023-07-12 ENCOUNTER — Other Ambulatory Visit: Payer: Self-pay

## 2023-07-12 ENCOUNTER — Telehealth: Payer: Self-pay | Admitting: Family Medicine

## 2023-07-12 NOTE — Telephone Encounter (Signed)
 Labs placed.

## 2023-07-16 ENCOUNTER — Other Ambulatory Visit: Payer: Medicare Other

## 2023-07-17 ENCOUNTER — Encounter: Payer: Self-pay | Admitting: Family Medicine

## 2023-07-17 LAB — CALCIUM, IONIZED: Calcium, Ion: 5.5 mg/dL (ref 4.5–5.6)

## 2023-07-17 LAB — PTH, INTACT AND CALCIUM
Calcium: 10.2 mg/dL (ref 8.7–10.3)
PTH: 56 pg/mL (ref 15–65)

## 2023-07-18 NOTE — Progress Notes (Signed)
 Name: Melinda Bennett DOB: 12/24/1955 MRN: [id number]  History of Present Illness: Melinda Bennett is a 68 y.o. female who presents today as a new patient at [facility]. All available relevant medical records have been reviewed.   Today: She reports bothersome urinary urgency, urge incontinence, and nocturia x2. Denies bothersome daytime urinary frequency. She has increased her water intake significantly over the past several months as instructed elsewhere to thin out mucous related to her bronchiestasis. Denies caffeine intake.   She also reports bothersome stress urinary incontinence with cough/laugh/sneeze/heavy lifting.  She reports the SUI is predominant compared to the UUI.   She denies dysuria, gross hematuria, straining to void, or sensations of incomplete emptying.  She denies history of recent or recurrent UTI. She denies history of kidney stones.   History of fourth-degree perineal tear during the birth of her first child; had 4 children total.  She denies vaginal pain, bleeding, abnormal discharge, itching, dryness. She denies vaginal bulge sensation.   She denies occasional fecal incontinence.   Medications: Current Outpatient Medications  Medication Sig Dispense Refill   alendronate (FOSAMAX) 70 MG tablet TAKE 1 TABLET ONCE A WEEK WITH A FULL GLASS OF WATER ON EMPTY STOMACH 12 tablet 0   atenolol (TENORMIN) 25 MG tablet Take 0.5 tablets (12.5 mg total) by mouth at bedtime. 90 tablet 3   Calcium Carbonate-Vit D-Min (CALCIUM 1200 PO) Take by mouth daily.     rosuvastatin (CRESTOR) 5 MG tablet 3-5 times weekly as tolerated 80 tablet 3   VITAMIN D PO Take by mouth.     No current facility-administered medications for this visit.    Allergies: Allergies  Allergen Reactions   Amoxicillin Rash   Penicillins Rash    Past Medical History:  Diagnosis Date   Hyperlipidemia    Osteoporosis    Rosacea    Past Surgical History:  Procedure Laterality  Date   BREAST BIOPSY Left    1995   DENTAL SURGERY     GALLBLADDER SURGERY     Family History  Problem Relation Age of Onset   Hypertension Mother    Atrial fibrillation Mother    Breast cancer Neg Hx    Social History   Socioeconomic History   Marital status: Married    Spouse name: Programme researcher, broadcasting/film/video   Number of children: 4   Years of education: 16   Highest education level: Master's degree (e.g., MA, MS, MEng, MEd, MSW, MBA)  Occupational History   Not on file  Tobacco Use   Smoking status: Never   Smokeless tobacco: Never  Vaping Use   Vaping status: Never Used  Substance and Sexual Activity   Alcohol use: Never   Drug use: Never   Sexual activity: Yes    Comment: married  since 1982, 4 children all grown  Other Topics Concern   Not on file  Social History Narrative   Not on file   Social Drivers of Health   Financial Resource Strain: Low Risk  (06/19/2023)   Overall Financial Resource Strain (CARDIA)    Difficulty of Paying Living Expenses: Not hard at all  Food Insecurity: No Food Insecurity (06/19/2023)   Hunger Vital Sign    Worried About Running Out of Food in the Last Year: Never true    Ran Out of Food in the Last Year: Never true  Transportation Needs: No Transportation Needs (06/19/2023)   PRAPARE - Transportation    Lack of Transportation (Medical): No    Lack  of Transportation (Non-Medical): No  Physical Activity: Unknown (06/19/2023)   Exercise Vital Sign    Days of Exercise per Week: 3 days    Minutes of Exercise per Session: Not on file  Stress: No Stress Concern Present (06/19/2023)   [employer] of Occupational Health - Occupational Stress Questionnaire    Feeling of Stress : Not at all  Social Connections: Socially Integrated (06/19/2023)   Social Connection and Isolation Panel [NHANES]    Frequency of Communication with Friends and Family: More than three times a week    Frequency of Social Gatherings with Friends and Family: Not on file    Attends  Religious Services: More than 4 times per year    Active Member of [employer] or Organizations: Yes    Attends Banker Meetings: More than 4 times per year    Marital Status: Married  Catering manager Violence: Unknown (08/17/2021)   Received from [employer], [facility]   HITS    Physically Hurt: Not on file    Insult or Talk Down To: Not on file    Threaten Physical Harm: Not on file    Scream or Curse: Not on file    SUBJECTIVE  Review of Systems Constitutional: Patient denies any unintentional weight loss or change in strength lntegumentary: Patient denies any rashes or pruritus Cardiovascular: Patient denies chest pain or syncope Respiratory: Patient denies shortness of breath Gastrointestinal: As per HPI Musculoskeletal: Patient denies muscle cramps or weakness Neurologic: Patient denies convulsions or seizures Allergic/Immunologic: Patient denies recent allergic reaction(s) Hematologic/Lymphatic: Patient denies bleeding tendencies Endocrine: Patient denies heat/cold intolerance  GU: As per HPI.  OBJECTIVE Vitals:   07/19/23 1006  BP: 115/64  Pulse: 78  Temp: 98 F (36.7 C)   There is no height or weight on file to calculate BMI.  Physical Examination Constitutional: No obvious distress; patient is non-toxic appearing  Cardiovascular: No visible lower extremity edema.  Respiratory: The patient does not have audible wheezing/stridor; respirations do not appear labored  Gastrointestinal: Abdomen non-distended Musculoskeletal: Normal ROM of UEs  Skin: No obvious rashes/open sores  Neurologic: CN 2-12 grossly intact Psychiatric: Answered questions appropriately with normal affect  Hematologic/Lymphatic/Immunologic: No obvious bruises or sites of spontaneous bleeding  UA: negative  PVR: 0 ml  ASSESSMENT Mixed stress and urge urinary incontinence - Plan: Urinalysis, Routine w reflex microscopic, BLADDER SCAN AMB NON-IMAGING, Ambulatory referral to  Urogynecology  Fourth degree perineal laceration during delivery - Plan: Ambulatory referral to Urogynecology  Fecal soiling due to fecal incontinence - Plan: Ambulatory referral to Urogynecology  Nocturia - Plan: Ambulatory referral to Urogynecology  Urinary urgency - Plan: Ambulatory referral to Urogynecology  1. OAB with urinary nocturia, urgency, and urge incontinence. We discussed the symptoms of overactive bladder (OAB), which include urinary urgency, frequency, nocturia, with or without urge incontinence. Likely exacerbated by increased fluid intake. We discussed the following management options in detail including potential benefits, risks, and side effects: Behavioral therapy: Modify fluid intake Avoid caffeine Medication(s) - Anticholinergic medications: We discussed potential side effects of anticholinergic medications such as urinary retention, dry eyes, dry mouth, constipation, confusion, cognitive impairment / dementia.  - Beta-3 agonist medications: We discussed potential side effects of beta-3 agonist medications such as urinary retention and (infrequently) elevated blood pressure.   She decided to proceed with timed voiding / bladder retraining.  2. Stress urinary incontinence. The etiology of this condition was explained in detail to include pelvic floor muscle relaxation and detachment of the urethra  away from its connection to the pubic bone. Her risk profile was reviewed, including childbirth.  The management options were reviewed to include: No intervention, observation. Non-surgical options: Pelvic floor muscle rehabilitation Incontinence pessary Surgical consultation   She elected to proceed with referral to Urogynecology - would like surgical consultation for SUI and possible FI.  3. Fecal incontinence. We discussed possible etiologies such as abnormal stool consistency, levator diastasis, disruption of anal sphincter integrity, etc. May be eligible for  surgical correction if indicated based on Urogynecology evaluation. Discussed fiber supplementation to optimize stool consistency.   4. History of fourth-degree perineal tear during childbirth.  We agreed to follow up on an as-needed basis. Pt verbalized understanding and agreement. All questions were answered.  PLAN Advised the following: 1. Timed voiding / bladder retraining. 2. Referred to Urogynecology. 3. Return if symptoms worsen or fail to improve.  Orders Placed This Encounter  Procedures   Urinalysis, Routine w reflex microscopic   Ambulatory referral to Urogynecology    Referral Priority:   Routine    Referral Type:   Consultation    Referral Reason:   Specialty Services Required    Requested Specialty:   Urology    Number of Visits Requested:   1   BLADDER SCAN AMB NON-IMAGING   Total time spent caring for the patient today was over 45 minutes. This includes time spent on the date of the visit reviewing the patient's chart before the visit, time spent during the visit, and time spent after the visit on documentation. Over 50% of that time was spent in face-to-face time with this patient for direct counseling. E&M based on time and complexity of medical decision making.  It has been explained that the patient is to follow regularly with their PCP in addition to all other providers involved in their care and to follow instructions provided by these respective offices. Patient advised to contact urology clinic if any urologic-pertaining questions, concerns, new symptoms or problems arise in the interim period.  There are no Patient Instructions on file for this visit.  Electronically signed by:  Donnita Falls, MSN, FNP-C, CUNP 07/19/2023 1:22 PM

## 2023-07-19 ENCOUNTER — Encounter: Payer: Self-pay | Admitting: Urology

## 2023-07-19 ENCOUNTER — Ambulatory Visit: Payer: Medicare Other | Admitting: Urology

## 2023-07-19 VITALS — BP 115/64 | HR 78 | Temp 98.0°F

## 2023-07-19 DIAGNOSIS — R3915 Urgency of urination: Secondary | ICD-10-CM

## 2023-07-19 DIAGNOSIS — R159 Full incontinence of feces: Secondary | ICD-10-CM | POA: Diagnosis not present

## 2023-07-19 DIAGNOSIS — R351 Nocturia: Secondary | ICD-10-CM | POA: Diagnosis not present

## 2023-07-19 DIAGNOSIS — N3281 Overactive bladder: Secondary | ICD-10-CM | POA: Diagnosis not present

## 2023-07-19 DIAGNOSIS — N3946 Mixed incontinence: Secondary | ICD-10-CM | POA: Diagnosis not present

## 2023-07-19 LAB — URINALYSIS, ROUTINE W REFLEX MICROSCOPIC
Bilirubin, UA: NEGATIVE
Glucose, UA: NEGATIVE
Ketones, UA: NEGATIVE
Leukocytes,UA: NEGATIVE
Nitrite, UA: NEGATIVE
Protein,UA: NEGATIVE
RBC, UA: NEGATIVE
Specific Gravity, UA: 1.015 (ref 1.005–1.030)
Urobilinogen, Ur: 0.2 mg/dL (ref 0.2–1.0)
pH, UA: 7 (ref 5.0–7.5)

## 2023-07-19 LAB — BLADDER SCAN AMB NON-IMAGING: Scan Result: 0

## 2023-07-24 DIAGNOSIS — R351 Nocturia: Secondary | ICD-10-CM | POA: Diagnosis not present

## 2023-07-24 DIAGNOSIS — N3946 Mixed incontinence: Secondary | ICD-10-CM | POA: Diagnosis not present

## 2023-07-24 DIAGNOSIS — R350 Frequency of micturition: Secondary | ICD-10-CM | POA: Diagnosis not present

## 2023-08-14 ENCOUNTER — Ambulatory Visit
Admission: RE | Admit: 2023-08-14 | Discharge: 2023-08-14 | Disposition: A | Discharge status: Home / Self Care | Source: Ambulatory Visit | Attending: Acute Care | Admitting: Acute Care

## 2023-08-14 DIAGNOSIS — I700 Atherosclerosis of aorta: Secondary | ICD-10-CM | POA: Diagnosis not present

## 2023-08-14 DIAGNOSIS — R918 Other nonspecific abnormal finding of lung field: Secondary | ICD-10-CM | POA: Diagnosis not present

## 2023-08-14 DIAGNOSIS — J479 Bronchiectasis, uncomplicated: Secondary | ICD-10-CM | POA: Diagnosis not present

## 2023-08-14 DIAGNOSIS — I2510 Atherosclerotic heart disease of native coronary artery without angina pectoris: Secondary | ICD-10-CM | POA: Diagnosis not present

## 2023-08-16 ENCOUNTER — Ambulatory Visit: Admitting: Acute Care

## 2023-08-29 DIAGNOSIS — R350 Frequency of micturition: Secondary | ICD-10-CM | POA: Diagnosis not present

## 2023-08-29 DIAGNOSIS — N3946 Mixed incontinence: Secondary | ICD-10-CM | POA: Diagnosis not present

## 2023-09-04 IMAGING — MG MM DIGITAL SCREENING BILAT W/ TOMO AND CAD
8 series · 8 of 24 positions shown · non-contrast
Comparison: Previous exam(s).

ACR Breast Density Category a: The breast tissue is almost entirely
fatty.

CLINICAL DATA: Screening.

EXAM:
DIGITAL SCREENING BILATERAL MAMMOGRAM WITH TOMOSYNTHESIS AND CAD
TECHNIQUE: Bilateral screening digital craniocaudal and mediolateral oblique
mammograms were obtained. Bilateral screening digital breast
tomosynthesis was performed. The images were evaluated with
computer-aided detection.

[R CC synth-2D]
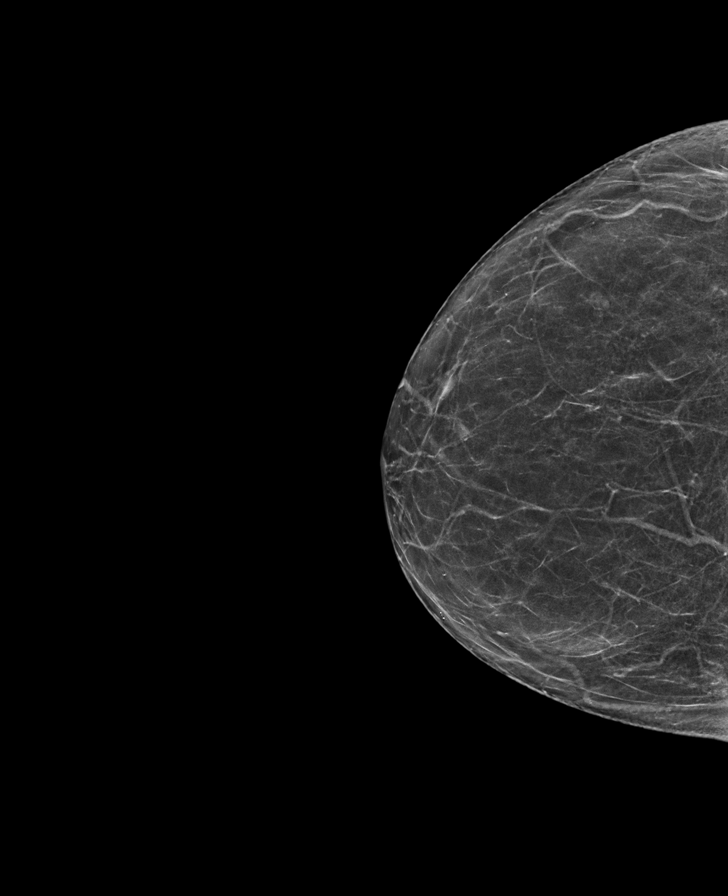

[L MLO synth-2D]
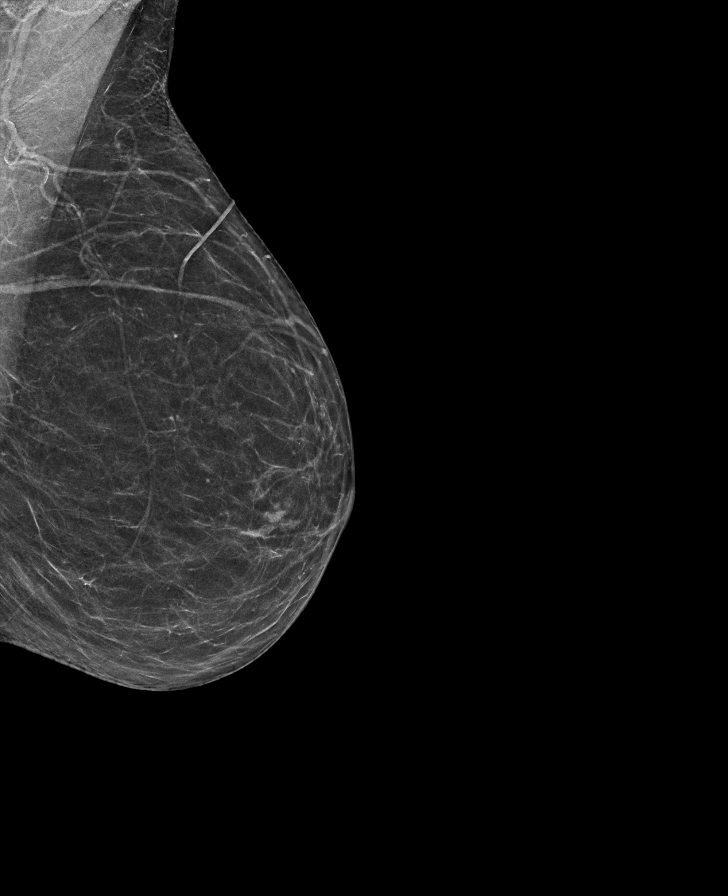

[L CC synth-2D]
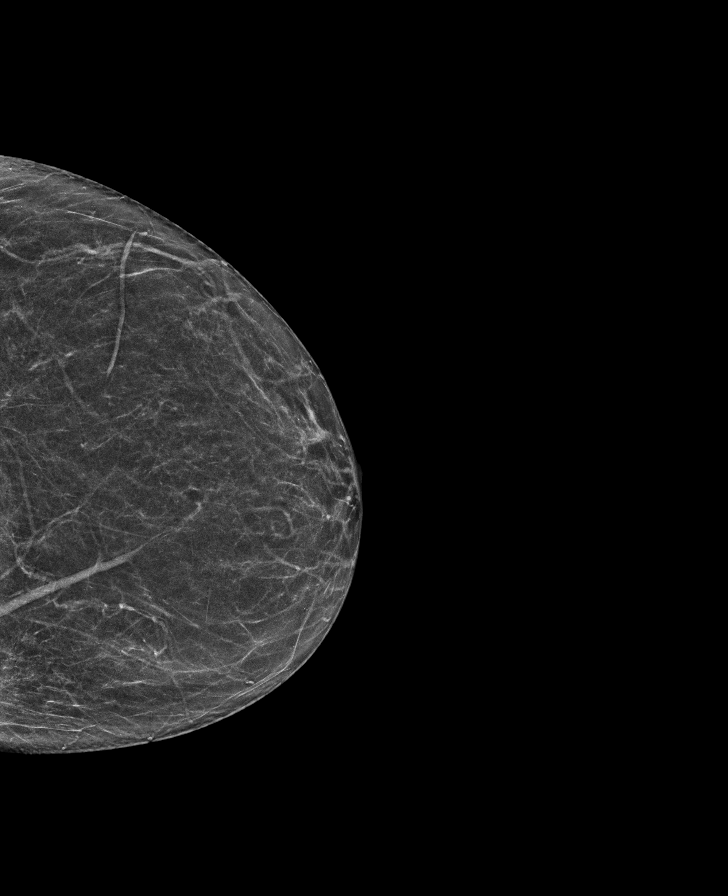

[R MLO synth-2D]
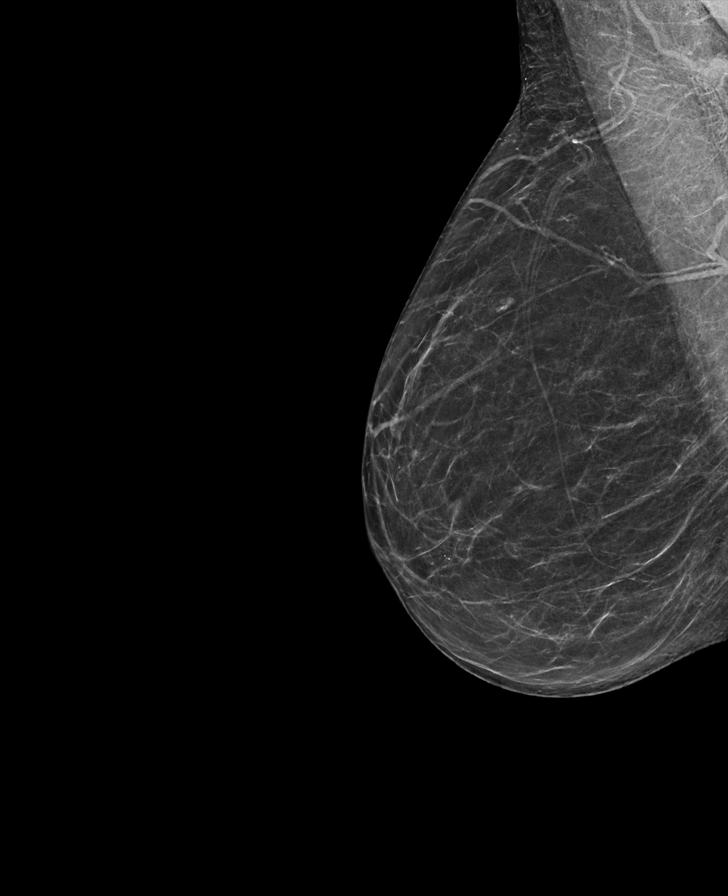

[R CC tomo · tomo slice 32/63.0]
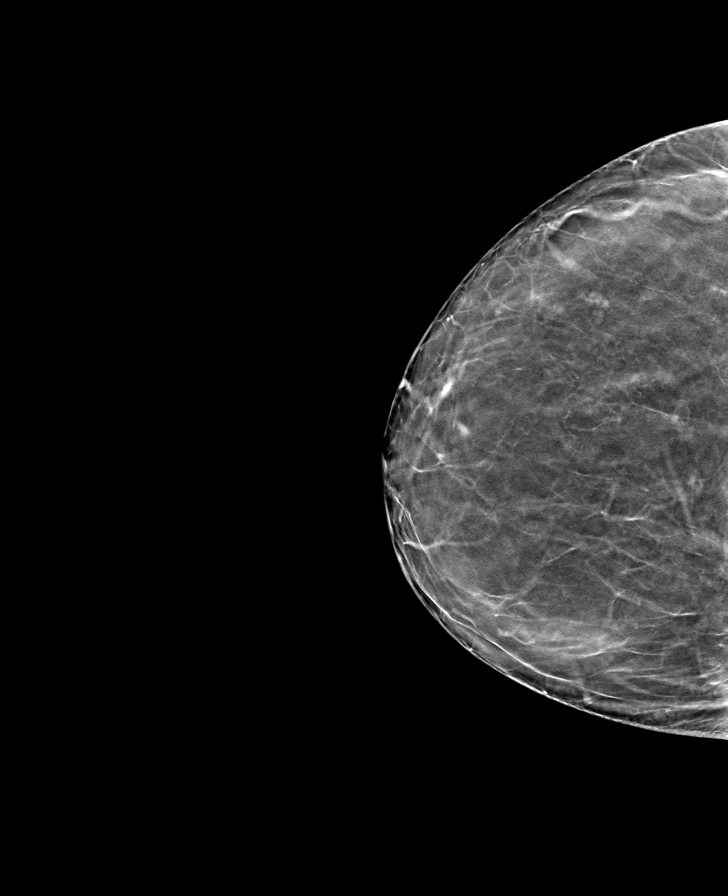

[R MLO tomo · tomo slice 32/63.0]
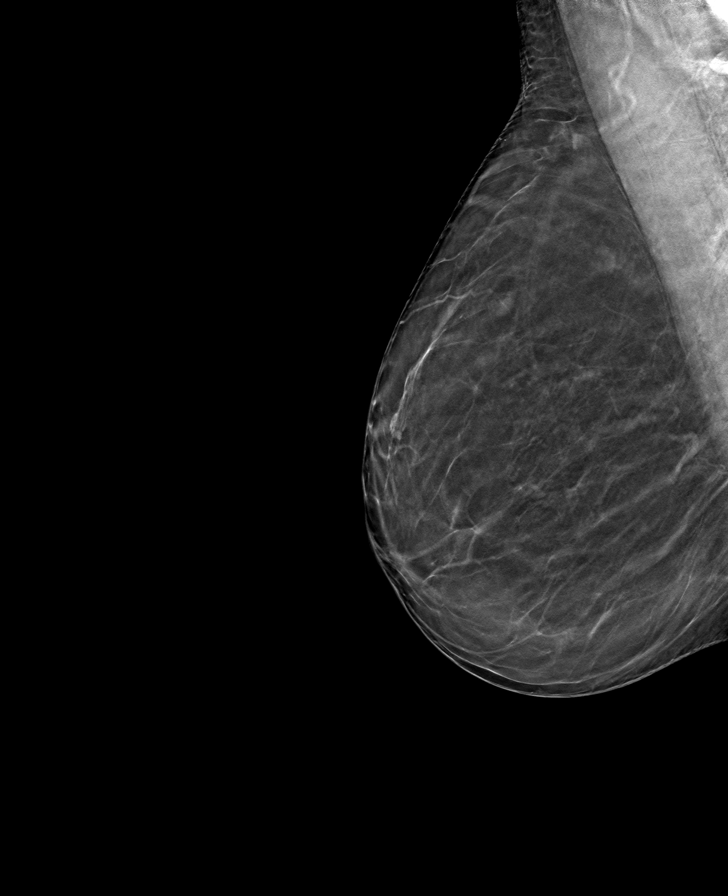

[L CC tomo · tomo slice 28/55.0]
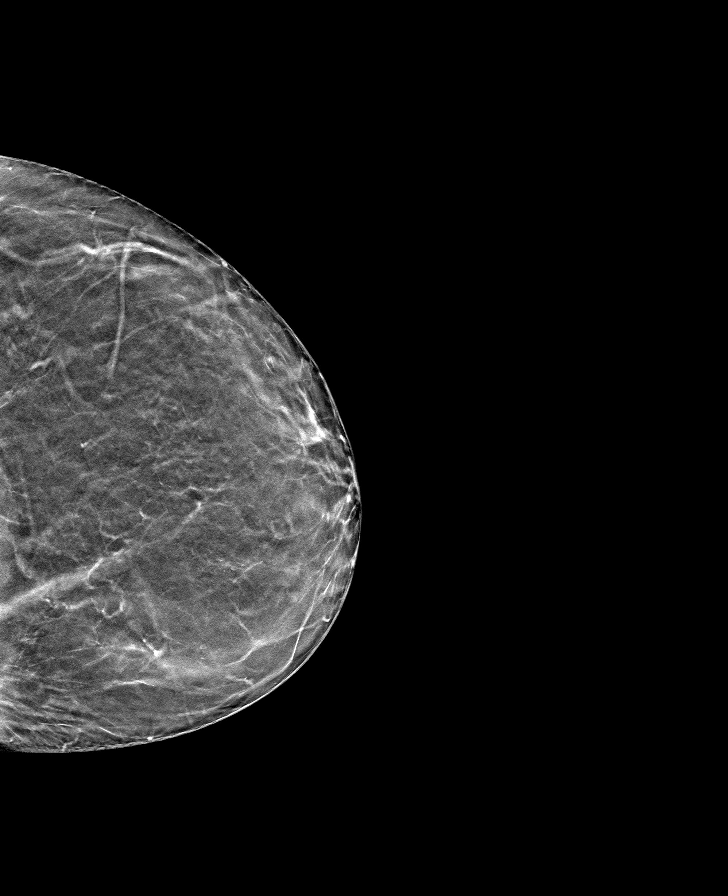

[L MLO tomo · tomo slice 31/60.0]
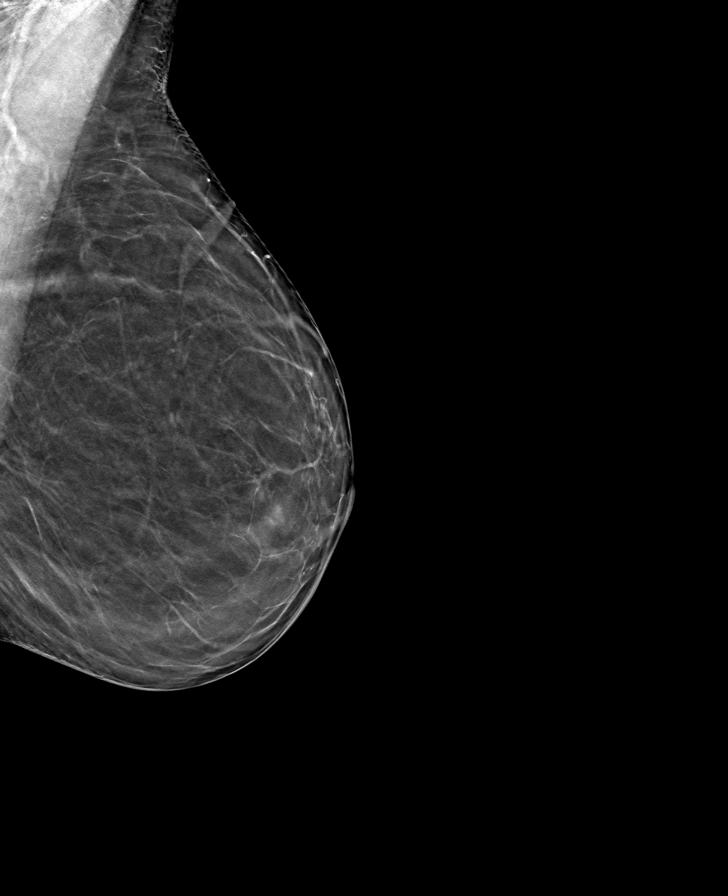

[8 of 24 positions shown; findings below may reference images not displayed]

FINDINGS: There are no findings suspicious for malignancy.
IMPRESSION: No mammographic evidence of malignancy. A result letter of this
screening mammogram will be mailed directly to the patient.

RECOMMENDATION:
Screening mammogram in one year. (Code:0E-3-N98)

BI-RADS CATEGORY  1: Negative.

## 2023-09-11 DIAGNOSIS — N3946 Mixed incontinence: Secondary | ICD-10-CM | POA: Diagnosis not present

## 2023-09-11 DIAGNOSIS — R350 Frequency of micturition: Secondary | ICD-10-CM | POA: Diagnosis not present

## 2023-09-24 NOTE — Progress Notes (Unsigned)
 @Patient  ID: Melinda Bennett, female    DOB: 05/14/1956, 68 y.o.   MRN: [id number]  No chief complaint on file.   Referring provider: Galvin Jules, FNP  HPI: 68 year female, never smoker followed for bronchiectasis and lung nodules. She was last seen in office 06/13/2023 by Margit Shelling NP. Past medical history significant for HLD, osteoporosis.   Had COVID in August 2024 and seen in the emergency room 01/22/2019 for.  Had a CTA of the chest that ruled out pulmonary embolism but found an incidental lung nodule.  She was seen by Dr. Bertrum Brodie 03/17/2024 with repeat CT imaging showing waxing and waning bilateral pulmonary nodules with tree-in-bud nodularity.  TEST/EVENTS:  01/22/2023 CTA chest: No PE.  Calcified mediastinal and right hilar nodes, normal size.  Multiple right lung calcified granulomata.  Mild biapical pleural and parenchymal scarring.  Lingular cylindrical and cystic btx.  4 mm left upper lobe nodule.  Larger, more linear shaped nodular density in left lower lobe 17 x 8 mm 2 mm subpleural nodule similar sized nodule in the left upper lobe 05/13/2023 super D CT chest: Atherosclerosis.  Calcified nodal tissue in precarinal and right hilum space.  Biapical pleural-parenchymal scarring.  Bilateral pulmonary nodules, largest of which 15 x 7 mm and stable compared to prior scan.  Tree-in-bud nodularity, new in interval.  Dominant nodule in this cluster measures 5 mm.  Tree-in-bud nodularity with areas of mucoid impaction in left upper lobe and new elongated nodular opacity measuring 14 x 5 mm.  Adjacent 5 mm left upper lobe nodule, similar to prior.  Lingular scarring with associated BTX 08/14/2023 CT chest: Atherosclerosis/CAD.  Calcified right paratracheal and right hilar nodes.  Bronchiectasis, tree-in-bud nodularity and mucoid impaction, favors MAI.  Stable appearance of multiple bilateral lung nodules, largest 1.4 cm and left lower lobe.  06/13/2023: Margarito Shearing with Margit Shelling NP.  Review of CT of the chest  shows some waxing and waning lung nodules, most likely infectious or inflammatory.  Notation of bronchiectasis on CT as well.  Does have a cough but unable to mobilize secretions.  She does have a sensation of secretions moving in her chest when she lies on her side.  Will start her on mucociliary clearance therapy with Mucinex and flutter valve.  Plan to repeat CT chest in 3 months to ensure stability.  If she develops sputum production, will culture secretions.  09/25/2023: Today - follow up Discussed the use of AI scribe software for clinical note transcription with the patient, who gave verbal consent to proceed.  History of Present Illness   Melinda Bennett is a 68 year old female with bronchiectasis who presents for follow up.   She experiences ongoing fatigue and chest tightness, feeling tired upon waking despite adequate sleep and needing to pace herself during activities. 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 No cough is present. Doesn't have chest congestion.   She has a history of bronchiectasis and lung nodules. Despite attempts with Mucinex, increased water intake, and using a flutter valve, there is no significant sputum production. She also tried percussive techniques and postural drainage without success and discontinued Mucinex after a while due to gastrointestinal side effects.  She had COVID-19 in August 2024, followed by an episode of tachycardia a month later, leading to an ER visit where a CT scan ruled out pulmonary  embolism but incidental findings of scattered nodularity and btx.   No productive cough, fever, weight loss, night sweats, or hemoptysis. Never used any inhalers.   She was previously on atenolol  for post-COVID tachycardia but has since weaned off it at the end of March. She reports occasional tachycardia  during activities but no longer experiences nighttime palpitations like she was before.  She does have some snoring from what she's been told. Never had any issues with drowsy driving. No morning headaches or witnessed apneas. She's never had a sleep study before.       Allergies  Allergen Reactions   Amoxicillin Rash   Penicillins Rash    Immunization History  Administered Date(s) Administered   Fluad Quad(high Dose 65+) 02/08/2020, 03/21/2022   Fluad Trivalent(High Dose 65+) 03/05/2023   Influenza,inj,Quad PF,6+ Mos 02/27/2018   Influenza-Unspecified 02/20/2021   Moderna SARS-COV2 Booster Vaccination 04/01/2020   Moderna Sars-Covid-2 Vaccination 08/31/2019, 09/28/2019   PNEUMOCOCCAL CONJUGATE-20 06/15/2021   Tdap 02/08/2020   Zoster Recombinant(Shingrix ) 10/30/2018, 03/31/2019    Past Medical History:  Diagnosis Date   Hyperlipidemia    Osteoporosis    Rosacea     Tobacco History: Social History   Tobacco Use  Smoking Status Never  Smokeless Tobacco Never   Counseling given: Not Answered   Outpatient Medications Prior to Visit  Medication Sig Dispense Refill   alendronate  (FOSAMAX ) 70 MG tablet TAKE 1 TABLET ONCE A WEEK WITH A FULL GLASS OF WATER ON EMPTY STOMACH 12 tablet 0   atenolol  (TENORMIN ) 25 MG tablet Take 0.5 tablets (12.5 mg total) by mouth at bedtime. 90 tablet 3   Calcium  Carbonate-Vit D-Min (CALCIUM  1200 PO) Take by mouth daily.     rosuvastatin  (CRESTOR ) 5 MG tablet 3-5 times weekly as tolerated 80 tablet 3   VITAMIN D  PO Take by mouth.     No facility-administered medications prior to visit.     Review of Systems:   Constitutional: No weight loss or gain, night sweats, fevers, chills, fatigue, or lassitude. HEENT: No headaches, difficulty swallowing, tooth/dental problems, or sore throat. No sneezing, itching, ear ache, nasal congestion, or post nasal drip CV:  No chest pain, orthopnea, PND, swelling in lower extremities, anasarca, dizziness,  palpitations, syncope Resp: No shortness of breath with exertion or at rest. No excess mucus or change in color of mucus. No productive or non-productive. No hemoptysis. No wheezing.  No chest wall deformity GI:  No heartburn, indigestion, abdominal pain, nausea, vomiting, diarrhea, change in bowel habits, loss of appetite, bloody stools.  GU: No dysuria, change in color of urine, urgency or frequency.  No flank pain, no hematuria  Skin: No rash, lesions, ulcerations MSK:  No joint pain or swelling.  No decreased range of motion.  No back pain. Neuro: No dizziness or lightheadedness.  Psych: No depression or anxiety. Mood stable.     Physical Exam:  There were no vitals taken for this visit.  GEN: Pleasant, interactive, well-nourished/chronically-ill appearing/acutely-ill appearing/poorly-nourished/morbidly obese; in no acute distress.****** HEENT:  Normocephalic and atraumatic. EACs patent bilaterally. TM pearly gray with present light reflex bilaterally. PERRLA. Sclera white. Nasal turbinates pink, moist and patent bilaterally. No rhinorrhea present. Oropharynx pink and moist, without exudate or edema. No lesions, ulcerations, or postnasal drip.  NECK:  Supple w/ fair ROM. No JVD present. Normal carotid impulses w/o bruits. Thyroid  symmetrical with no goiter or nodules palpated. No lymphadenopathy.   CV: RRR, no m/r/g, no peripheral edema. Pulses intact, +2 bilaterally. No cyanosis, pallor  or clubbing. PULMONARY:  Unlabored, regular breathing. Clear bilaterally A&P w/o wheezes/rales/rhonchi. No accessory muscle use.  GI: BS present and normoactive. Soft, non-tender to palpation. No organomegaly or masses detected. No CVA tenderness. MSK: No erythema, warmth or tenderness. Cap refil <2 sec all extrem. No deformities or joint swelling noted.  Neuro: A/Ox3. No focal deficits noted.   Skin: Warm, no lesions or rashe Psych: Normal affect and behavior. Judgement and thought content appropriate.      Lab Results:  CBC    Component Value Date/Time   WBC 4.8 06/20/2023 0925   WBC 9.4 01/22/2023 1410   RBC 4.52 06/20/2023 0925   RBC 4.30 01/22/2023 1410   HGB 12.9 06/20/2023 0925   HCT 38.9 06/20/2023 0925   PLT 257 06/20/2023 0925   MCV 86 06/20/2023 0925   MCH 28.5 06/20/2023 0925   MCH 28.4 01/22/2023 1410   MCHC 33.2 06/20/2023 0925   MCHC 31.5 01/22/2023 1410   RDW 12.0 06/20/2023 0925   LYMPHSABS 1.7 06/20/2023 0925   EOSABS 0.1 06/20/2023 0925   BASOSABS 0.0 06/20/2023 0925    BMET    Component Value Date/Time   NA 141 06/20/2023 0925   K 4.3 06/20/2023 0925   CL 103 06/20/2023 0925   CO2 25 06/20/2023 0925   GLUCOSE 80 06/20/2023 0925   GLUCOSE 135 (H) 01/22/2023 1410   BUN 16 06/20/2023 0925   CREATININE 0.75 06/20/2023 0925   CALCIUM  10.2 07/16/2023 0905   GFRNONAA >60 01/22/2023 1410   GFRAA 106 02/08/2020 1017    BNP No results found for: "BNP"   Imaging:  No results found.  Administration History     None           No data to display          No results found for: "NITRICOXIDE"      Assessment & Plan:   No problem-specific Assessment & Plan notes found for this encounter.   Advised if symptoms do not improve or worsen, to please contact office for sooner follow up or seek emergency care.   I spent *** minutes of dedicated to the care of this patient on the date of this encounter to include pre-visit review of records, face-to-face time with the patient discussing conditions above, post visit ordering of testing, clinical documentation with the electronic health record, making appropriate referrals as documented, and communicating necessary findings to members of the patients care team.  Roetta Clarke, NP 09/24/2023  Pt aware and understands NP's role.

## 2023-09-25 ENCOUNTER — Ambulatory Visit (HOSPITAL_BASED_OUTPATIENT_CLINIC_OR_DEPARTMENT_OTHER): Admitting: Nurse Practitioner

## 2023-09-25 ENCOUNTER — Other Ambulatory Visit: Payer: Self-pay | Admitting: Nurse Practitioner

## 2023-09-25 ENCOUNTER — Encounter (HOSPITAL_BASED_OUTPATIENT_CLINIC_OR_DEPARTMENT_OTHER): Payer: Self-pay | Admitting: Nurse Practitioner

## 2023-09-25 VITALS — BP 135/58 | HR 90 | Ht 67.0 in | Wt 138.4 lb

## 2023-09-25 DIAGNOSIS — R918 Other nonspecific abnormal finding of lung field: Secondary | ICD-10-CM | POA: Diagnosis not present

## 2023-09-25 DIAGNOSIS — R0683 Snoring: Secondary | ICD-10-CM | POA: Diagnosis not present

## 2023-09-25 DIAGNOSIS — G4719 Other hypersomnia: Secondary | ICD-10-CM | POA: Diagnosis not present

## 2023-09-25 DIAGNOSIS — J479 Bronchiectasis, uncomplicated: Secondary | ICD-10-CM | POA: Diagnosis not present

## 2023-09-25 DIAGNOSIS — R0609 Other forms of dyspnea: Secondary | ICD-10-CM | POA: Diagnosis not present

## 2023-09-25 MED ORDER — ALBUTEROL SULFATE HFA 108 (90 BASE) MCG/ACT IN AERS: 2.0000 | INHALATION_SPRAY | Freq: Four times a day (QID) | RESPIRATORY_TRACT | 2 refills | Status: DC | PRN

## 2023-09-25 NOTE — Patient Instructions (Signed)
 Trial Albuterol inhaler 2 puffs every 6 hours as needed for shortness of breath or wheezing. Notify if symptoms persist despite rescue inhaler/neb use.   Pulmonary function testing ordered for further evaluation Bronchiectasis can sometimes cause an obstruction in your lung function, which could be contributing to your symptoms   Monitor sputum production/cough. Let us  know if you start having issues with a cough or begin coughing up any phlegm   Repeat CT chest in October 2025  Labs today   Given your symptoms, I am concerned that you may have sleep disordered breathing with sleep apnea. You will need a sleep study for further evaluation. Someone will contact you to schedule this.   We discussed how untreated sleep apnea puts an individual at risk for cardiac arrhthymias, pulm HTN, DM, stroke and increases their risk for daytime accidents. We also briefly reviewed treatment options including weight loss, side sleeping position, oral appliance, CPAP therapy or referral to ENT for possible surgical options  Use caution when driving and pull over if you become sleepy.  Follow up in 6 weeks after PFT and HST with Dr. Baldwin Levee (1st) or Katie Demetres Prochnow,NP. If symptoms do not improve or worsen, please contact office for sooner follow up or seek emergency care.

## 2023-09-26 ENCOUNTER — Encounter (HOSPITAL_BASED_OUTPATIENT_CLINIC_OR_DEPARTMENT_OTHER): Payer: Self-pay | Admitting: Nurse Practitioner

## 2023-09-26 ENCOUNTER — Ambulatory Visit: Payer: Self-pay | Admitting: Nurse Practitioner

## 2023-09-26 ENCOUNTER — Telehealth (HOSPITAL_BASED_OUTPATIENT_CLINIC_OR_DEPARTMENT_OTHER): Payer: Self-pay

## 2023-09-26 LAB — IGG, IGA, IGM
IgA/Immunoglobulin A, Serum: 127 mg/dL (ref 87–352)
IgG (Immunoglobin G), Serum: 923 mg/dL (ref 586–1602)
IgM (Immunoglobulin M), Srm: 85 mg/dL (ref 26–217)

## 2023-09-26 NOTE — Progress Notes (Signed)
 Pt.notified

## 2023-09-26 NOTE — Progress Notes (Signed)
 Immunoglobulin testing normal.

## 2023-09-27 NOTE — Telephone Encounter (Signed)
 She said some light snoring during our visit, which was why that was on there. I can remove if that is not correct. It is up to her but given ongoing, unexplained fatigue symptoms, I think it would be worth while to rule out. Thanks!

## 2023-09-30 ENCOUNTER — Encounter (HOSPITAL_BASED_OUTPATIENT_CLINIC_OR_DEPARTMENT_OTHER): Payer: Self-pay

## 2023-09-30 NOTE — Telephone Encounter (Signed)
Pt notified on VM. 

## 2023-10-01 ENCOUNTER — Encounter: Payer: Self-pay | Admitting: Cardiovascular Disease

## 2023-10-03 ENCOUNTER — Ambulatory Visit (HOSPITAL_BASED_OUTPATIENT_CLINIC_OR_DEPARTMENT_OTHER): Admitting: Internal Medicine

## 2023-10-03 DIAGNOSIS — J479 Bronchiectasis, uncomplicated: Secondary | ICD-10-CM | POA: Diagnosis not present

## 2023-10-03 DIAGNOSIS — R0609 Other forms of dyspnea: Secondary | ICD-10-CM

## 2023-10-03 LAB — PULMONARY FUNCTION TEST
DL/VA % pred: 119 %
DL/VA: 4.88 ml/min/mmHg/L
DLCO cor % pred: 100 %
DLCO cor: 20.9 ml/min/mmHg
DLCO unc % pred: 100 %
DLCO unc: 20.9 ml/min/mmHg
FEF 25-75 Post: 2.7 L/s
FEF 25-75 Pre: 2.44 L/s
FEF2575-%Change-Post: 10 %
FEF2575-%Pred-Post: 134 %
FEF2575-%Pred-Pre: 121 %
FEV1-%Change-Post: 2 %
FEV1-%Pred-Post: 98 %
FEV1-%Pred-Pre: 95 %
FEV1-Post: 2.43 L
FEV1-Pre: 2.36 L
FEV1FVC-%Change-Post: 0 %
FEV1FVC-%Pred-Pre: 107 %
FEV6-%Change-Post: 1 %
FEV6-%Pred-Post: 94 %
FEV6-%Pred-Pre: 92 %
FEV6-Post: 2.94 L
FEV6-Pre: 2.88 L
FEV6FVC-%Change-Post: 0 %
FEV6FVC-%Pred-Post: 104 %
FEV6FVC-%Pred-Pre: 103 %
FVC-%Change-Post: 1 %
FVC-%Pred-Post: 91 %
FVC-%Pred-Pre: 89 %
FVC-Post: 2.95 L
FVC-Pre: 2.9 L
Post FEV1/FVC ratio: 82 %
Post FEV6/FVC ratio: 100 %
Pre FEV1/FVC ratio: 82 %
Pre FEV6/FVC Ratio: 100 %
RV % pred: 83 %
RV: 1.92 L
TLC % pred: 90 %
TLC: 4.86 L

## 2023-10-03 NOTE — Progress Notes (Signed)
 Full PFT performed today.

## 2023-10-03 NOTE — Patient Instructions (Signed)
 Full PFT performed today.

## 2023-10-04 ENCOUNTER — Ambulatory Visit: Payer: Self-pay | Admitting: Nurse Practitioner

## 2023-10-04 ENCOUNTER — Encounter

## 2023-10-04 DIAGNOSIS — R0683 Snoring: Secondary | ICD-10-CM

## 2023-10-04 DIAGNOSIS — G4719 Other hypersomnia: Secondary | ICD-10-CM

## 2023-10-04 DIAGNOSIS — G4730 Sleep apnea, unspecified: Secondary | ICD-10-CM | POA: Diagnosis not present

## 2023-10-08 ENCOUNTER — Other Ambulatory Visit: Payer: Self-pay | Admitting: Family Medicine

## 2023-10-19 DIAGNOSIS — G4733 Obstructive sleep apnea (adult) (pediatric): Secondary | ICD-10-CM | POA: Diagnosis not present

## 2023-10-21 NOTE — Progress Notes (Signed)
 " Cardiology Office Note    Patient Name: Melinda Bennett Date of Encounter: 10/21/2023  Primary Care Provider:  Severa Rock HERO, FNP Primary Cardiologist:  Melinda ONEIDA Decent, MD Primary Electrophysiologist: None   Past Medical History    Past Medical History:  Diagnosis Date   Hyperlipidemia    Osteoporosis    Rosacea     History of Present Illness  Melinda Bennett is a 68 y.o. female with a PMH of PSVT, HLD, bronchiectasis, aortic atherosclerosis, coronary calcification who presents today for 62-month follow-up.  Melinda Bennett was seen initially by Dr. Decent for evaluation of SVT she previously wore an event monitor for complaint of palpitations that showed brief episode of SVT lasting around 10 beats.  She contracted COVID in August 2024 and began experiencing tachycardia in September of the same year.  She endorsed tachycardia being more noticeable at night.  She was evaluated in the ED and underwent CT of the chest that ruled out for pulmonary embolism but did show some changes secondary to COVID-19.  She was started on low-dose atenolol  by PCP with alleviation of symptoms.  During her visit she underwent an EKG that showed no evidence of SVT.  She was continued on atenolol  with consideration to discontinue at 70-month and fully discontinued in March 2025.  Melinda Bennett she reports today no recurrence of palpitations since discontinuation of atenolol .  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 She has undergone extensive cardiac workup, including wearing an event monitor that showed brief episodes of tachycardia and an EKG that did not show SVT. An echocardiogram was performed in October 2024. Her resting heart rate is typically in the high 70s, and it returns to baseline within 15 minutes after exertion. She has been evaluated for lung issues, with pulmonary function tests  returning normal results. She has been diagnosed with bronchiectasis. Recent CT scans have revealed aortic atherosclerosis and coronary artery calcifications. She is currently on rosuvastatin  5 mg, taken four times a week, which has effectively reduced her LDL cholesterol levels. She experiences headaches as a side effect of the medication. No current symptoms such as chest pain or shortness of breath. 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 She notes that she needs to pace herself more as she ages but considers herself more active than the average 70 year old. She follows a healthy diet, avoiding fried foods, although she admits to occasionally indulging in ice cream. Patient denies chest pain, palpitations, dyspnea, PND, orthopnea, nausea, vomiting, dizziness, syncope, edema, weight gain, or early satiety.  Discussed the use of AI scribe software for clinical note transcription with the patient, who gave verbal consent to proceed.  History of Present Illness   Review of Systems  Please see the history of present illness.    All other systems reviewed and are otherwise negative except as noted above.  Physical Exam     Wt Readings from Last 3 Encounters:  09/25/23 138 lb 6.4 oz (62.8 kg)  06/20/23 134 lb 9.6 oz (61.1 kg)  06/13/23 133 lb (60.3 kg)   CD:Uyzmz were no vitals filed for this visit.,There is no height or weight on file to calculate BMI. GEN: Well nourished, well developed in no acute distress Neck: No JVD; No carotid bruits Pulmonary: Clear to auscultation without rales, wheezing  or rhonchi  Cardiovascular: Normal rate. Regular rhythm. Normal S1. Normal S2.   Murmurs: There is no murmur.  ABDOMEN: Soft, non-tender, non-distended EXTREMITIES:  No edema; No deformity   EKG/LABS/ Recent Cardiac Studies   ECG personally reviewed by me today -none completed today  Risk  Assessment/Calculations:          Lab Results  Component Value Date   WBC 4.8 06/20/2023   HGB 12.9 06/20/2023   HCT 38.9 06/20/2023   MCV 86 06/20/2023   PLT 257 06/20/2023   Lab Results  Component Value Date   CREATININE 0.75 06/20/2023   BUN 16 06/20/2023   NA 141 06/20/2023   K 4.3 06/20/2023   CL 103 06/20/2023   CO2 25 06/20/2023   Lab Results  Component Value Date   CHOL 194 06/20/2023   HDL 81 06/20/2023   LDLCALC 102 (H) 06/20/2023   TRIG 59 06/20/2023   CHOLHDL 2.4 06/20/2023    Lab Results  Component Value Date   HGBA1C 5.4 01/23/2023   Assessment & Plan    Assessment & Plan  1.  SVT: - Patient reports no recurrence after discontinuation of atenolol  in March. - Reports elevation in heart rate with mild activity and normalizes post-activity. No structural heart changes on recent echo with recommendations to continue to abstain from beta-blocker. - Encourage aerobic activity for heart rate regulation.  2.  Hyperlipidemia: - Patient's LDL cholesterol was 102 on 06/20/2023 - On Crestor  3-5 times weekly due to side effects of headache and discomfort.  3.  Bronchiectasis: - Previous pulmonary function test normal and currently followed by pulmonology  4.  Aortic atherosclerosis/coronary calcifications: - Previously noted on CT of the chest and currently asymptomatic. -On rosuvastatin  5 mg four times a week.   CT calcium  score discussed for statin therapy guidance.  - Order CT calcium  score at [facility]. - Start baby aspirin  81 mg daily. - Monitor for bruising or bleeding. - Consider increasing rosuvastatin  or adding ezetimibe based on CT calcium  score.  Disposition: Follow-up with Melinda ONEIDA Decent, MD or APP in 12 months    Signed, Melinda Bennett, Melinda Shove, NP 10/21/2023, 7:54 AM [facility] "

## 2023-10-22 ENCOUNTER — Encounter: Payer: Self-pay | Admitting: Nurse Practitioner

## 2023-10-22 ENCOUNTER — Ambulatory Visit: Attending: Nurse Practitioner | Admitting: Nurse Practitioner

## 2023-10-22 VITALS — BP 116/64 | HR 78 | Ht 67.0 in | Wt 135.0 lb

## 2023-10-22 DIAGNOSIS — J479 Bronchiectasis, uncomplicated: Secondary | ICD-10-CM | POA: Diagnosis not present

## 2023-10-22 DIAGNOSIS — I2510 Atherosclerotic heart disease of native coronary artery without angina pectoris: Secondary | ICD-10-CM

## 2023-10-22 DIAGNOSIS — I700 Atherosclerosis of aorta: Secondary | ICD-10-CM | POA: Diagnosis not present

## 2023-10-22 DIAGNOSIS — R350 Frequency of micturition: Secondary | ICD-10-CM | POA: Diagnosis not present

## 2023-10-22 DIAGNOSIS — N3946 Mixed incontinence: Secondary | ICD-10-CM | POA: Diagnosis not present

## 2023-10-22 DIAGNOSIS — I471 Supraventricular tachycardia, unspecified: Secondary | ICD-10-CM

## 2023-10-22 DIAGNOSIS — E785 Hyperlipidemia, unspecified: Secondary | ICD-10-CM

## 2023-10-22 MED ORDER — ASPIRIN 81 MG PO TBEC: 81.0000 mg | DELAYED_RELEASE_TABLET | Freq: Every day | ORAL | Status: DC

## 2023-10-22 NOTE — Patient Instructions (Addendum)
 Medication Instructions:  START Aspirin 81mg  Take 1 tablet once a day *If you need a refill on your cardiac medications before your next appointment, please call your pharmacy*  Lab Work: None ordered If you have labs (blood work) drawn today and your tests are completely normal, you will receive your results only by: MyChart Message (if you have MyChart) OR A paper copy in the mail If you have any lab test that is abnormal or we need to change your treatment, we will call you to review the results.  Testing/Procedures: Calcium  score $99 test  Follow-Up: At [facility], you and your health needs are our priority.  As part of our continuing mission to provide you with exceptional heart care, our providers are all part of one team.  This team includes your primary Cardiologist (physician) and Advanced Practice Providers or APPs (Physician Assistants and Nurse Practitioners) who all work together to provide you with the care you need, when you need it.  Your next appointment:   12 month(s)  Provider:   Oneil Bigness, MD    We recommend signing up for the patient portal called "MyChart".  Sign up information is provided on this After Visit Summary.  MyChart is used to connect with patients for Virtual Visits (Telemedicine).  Patients are able to view lab/test results, encounter notes, upcoming appointments, etc.  Non-urgent messages can be sent to your provider as well.   To learn more about what you can do with MyChart, go to [email or web address].   Other Instructions

## 2023-10-23 NOTE — Progress Notes (Signed)
 HST revealed mild sleep apnea. Could be contributing to her daytime fatigue symptoms. Mild sleep apnea does not cause significant cardiovascular risks but some patients are very symptomatic because of it. Recommend trial of CPAP therapy. If she is adamant about not wanting to do this, could look at oral appliance but not always as cost effective

## 2023-10-24 NOTE — Progress Notes (Signed)
 Message left for pt on identifiable VM and My chart message sent also

## 2023-10-31 NOTE — Telephone Encounter (Signed)
 FYI

## 2023-11-18 DIAGNOSIS — N3946 Mixed incontinence: Secondary | ICD-10-CM | POA: Diagnosis not present

## 2023-11-18 DIAGNOSIS — R159 Full incontinence of feces: Secondary | ICD-10-CM | POA: Diagnosis not present

## 2023-11-18 DIAGNOSIS — M6281 Muscle weakness (generalized): Secondary | ICD-10-CM | POA: Diagnosis not present

## 2023-11-18 DIAGNOSIS — R350 Frequency of micturition: Secondary | ICD-10-CM | POA: Diagnosis not present

## 2023-11-21 ENCOUNTER — Ambulatory Visit (HOSPITAL_BASED_OUTPATIENT_CLINIC_OR_DEPARTMENT_OTHER)
Admission: RE | Admit: 2023-11-21 | Discharge: 2023-11-21 | Disposition: A | Discharge status: Home / Self Care | Payer: Self-pay | Source: Ambulatory Visit | Attending: Nurse Practitioner | Admitting: Nurse Practitioner

## 2023-11-21 DIAGNOSIS — J479 Bronchiectasis, uncomplicated: Secondary | ICD-10-CM | POA: Insufficient documentation

## 2023-11-21 DIAGNOSIS — E785 Hyperlipidemia, unspecified: Secondary | ICD-10-CM | POA: Insufficient documentation

## 2023-11-21 DIAGNOSIS — I471 Supraventricular tachycardia, unspecified: Secondary | ICD-10-CM | POA: Insufficient documentation

## 2023-11-24 ENCOUNTER — Ambulatory Visit: Payer: Self-pay | Admitting: Nurse Practitioner

## 2023-12-11 ENCOUNTER — Encounter (HOSPITAL_BASED_OUTPATIENT_CLINIC_OR_DEPARTMENT_OTHER): Payer: Self-pay | Admitting: Nurse Practitioner

## 2023-12-11 ENCOUNTER — Ambulatory Visit (HOSPITAL_BASED_OUTPATIENT_CLINIC_OR_DEPARTMENT_OTHER): Admitting: Nurse Practitioner

## 2023-12-11 VITALS — BP 119/70 | HR 97 | Ht 67.0 in | Wt 136.0 lb

## 2023-12-11 DIAGNOSIS — G4733 Obstructive sleep apnea (adult) (pediatric): Secondary | ICD-10-CM | POA: Diagnosis not present

## 2023-12-11 DIAGNOSIS — J479 Bronchiectasis, uncomplicated: Secondary | ICD-10-CM

## 2023-12-11 DIAGNOSIS — R5382 Chronic fatigue, unspecified: Secondary | ICD-10-CM | POA: Diagnosis not present

## 2023-12-11 NOTE — Progress Notes (Signed)
 @Patient  ID: Melinda Bennett, female    DOB: 1956-03-29, 68 y.o.   MRN: [id number]  Chief Complaint  Patient presents with   Follow-up    Referring provider: Severa Rock HERO, FNP  HPI: 68 year female, never smoker followed for bronchiectasis, lung nodules, and mild OSA. She was last seen in office 09/25/2023 by [facility] NP. Past medical history significant for HLD, osteoporosis.   Had COVID in August 2024 and seen in the emergency room 01/22/2023.  Had a CTA of the chest that ruled out pulmonary embolism but found an incidental lung nodule.  She was seen by Dr. Geronimo 03/17/2024 with repeat CT imaging showing waxing and waning bilateral pulmonary nodules with tree-in-bud nodularity.  TEST/EVENTS:  01/22/2023 CTA chest: No PE.  Calcified mediastinal and right hilar nodes, normal size.  Multiple right lung calcified granulomata.  Mild biapical pleural and parenchymal scarring.  Lingular cylindrical and cystic btx.  4 mm left upper lobe nodule.  Larger, more linear shaped nodular density in left lower lobe 17 x 8 mm 2 mm subpleural nodule similar sized nodule in the left upper lobe 05/13/2023 super D CT chest: Atherosclerosis.  Calcified nodal tissue in precarinal and right hilum space.  Biapical pleural-parenchymal scarring.  Bilateral pulmonary nodules, largest of which 15 x 7 mm and stable compared to prior scan.  Tree-in-bud nodularity, new in interval.  Dominant nodule in this cluster measures 5 mm.  Tree-in-bud nodularity with areas of mucoid impaction in left upper lobe and new elongated nodular opacity measuring 14 x 5 mm.  Adjacent 5 mm left upper lobe nodule, similar to prior.  Lingular scarring with associated BTX 08/14/2023 CT chest: Atherosclerosis/CAD.  Calcified right paratracheal and right hilar nodes.  Bronchiectasis, tree-in-bud nodularity and mucoid impaction, favors MAI.  Stable appearance of multiple bilateral lung nodules, largest 1.4 cm and left lower lobe. 10/04/2023 HST: AHI 8.5/h, SpO2  low 86%  06/13/2023: OV with [facility] NP.  Review of CT of the chest shows some waxing and waning lung nodules, most likely infectious or inflammatory.  Notation of bronchiectasis on CT as well.  Does have a cough but unable to mobilize secretions.  She does have a sensation of secretions moving in her chest when she lies on her side.  Will start her on mucociliary clearance therapy with Mucinex and flutter valve.  Plan to repeat CT chest in 3 months to ensure stability.  If she develops sputum production, will culture secretions.  09/25/2023: Today - follow up Discussed the use of AI scribe software for clinical note transcription with the patient, who gave verbal consent to proceed. Melinda Bennett is a 68 year old female with bronchiectasis who presents for follow up.  She experiences ongoing fatigue and chest tightness, feeling tired upon waking despite adequate sleep and needing to pace herself during activities. 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 No cough is present. Doesn't have chest congestion.  She has a history of bronchiectasis and lung nodules. Despite attempts with Mucinex, increased water intake, and using a flutter valve, there is no significant sputum production. She also tried percussive techniques and postural drainage without success and discontinued Mucinex after a while due to gastrointestinal side effects. She had COVID-19 in August 2024, followed by an episode of tachycardia a month later, leading to an ER visit where a CT scan  ruled out pulmonary embolism but incidental findings of scattered nodularity and btx.  No productive cough, fever, weight loss, night sweats, or hemoptysis. Never used any inhalers.  She was previously on atenolol  for post-COVID tachycardia but has since weaned off it at the end of March. She  reports occasional tachycardia during activities but no longer experiences nighttime palpitations like she was before. She does have some snoring from what she's been told. Never had any issues with drowsy driving. No morning headaches or witnessed apneas. She's never had a sleep study before.     12/11/2023: Today - follow up Discussed the use of AI scribe software for clinical note transcription with the patient, who gave verbal consent to proceed.  History of Present Illness Melinda Bennett is a 68 year old female with mild sleep apnea and bronchiectasis who presents for follow-up on her sleep study and lung function.  She underwent a sleep study revealing mild sleep apnea with an average of eight respiratory events per hour. She experiences about thirty snores per hour, which are mild to moderate in loudness. She has been sleeping on her stomach more but causes some discomfort from time to time so she will roll over. She clenches her teeth during sleep. Has some occasional fatigue. No issues with drowsy driving or morning headaches.   She has a history of bronchiectasis, discovered incidentally following a CT scan after experiencing tachycardia post-COVID infection. She did not have respiratory symptoms during her COVID infection. She does not experience a chronic productive cough. She occasionally feels a sensation of mucus in her throat, especially when lying down, but is unable to cough anything up. No significant cough during the day. Doesn't really feel like she has short windedness. Sometimes just decreased stamina/fatigue. Although, the heat does seem to make her activity tolerance worse. She has tried albuterol  without noticeable improvement in symptoms. No fevers, chills, hemoptysis, weight loss, fatigue.      Allergies  Allergen Reactions   Amoxicillin Rash   Penicillins Rash    Immunization History  Administered Date(s) Administered   Fluad Quad(high Dose 65+) 02/08/2020, 03/21/2022    Fluad Trivalent(High Dose 65+) 03/05/2023   Influenza,inj,Quad PF,6+ Mos 02/27/2018   Influenza-Unspecified 02/20/2021   Moderna SARS-COV2 Booster Vaccination 04/01/2020   Moderna Sars-Covid-2 Vaccination 08/31/2019, 09/28/2019   PNEUMOCOCCAL CONJUGATE-20 06/15/2021   Tdap 02/08/2020   Zoster Recombinant(Shingrix ) 10/30/2018, 03/31/2019    Past Medical History:  Diagnosis Date   Hyperlipidemia    Osteoporosis    Rosacea     Tobacco History: Social History   Tobacco Use  Smoking Status Never  Smokeless Tobacco Never   Counseling given: Not Answered   Outpatient Medications Prior to Visit  Medication Sig Dispense Refill   alendronate  (FOSAMAX ) 70 MG tablet TAKE 1 TABLET ONCE A WEEK WITH A FULL GLASS OF WATER ON EMPTY STOMACH 12 tablet 1   Calcium  Carbonate-Vit D-Min (CALCIUM  1200 PO) Take by mouth daily.     rosuvastatin  (CRESTOR ) 5 MG tablet 3-5 times weekly as tolerated 80 tablet 3   Vibegron (GEMTESA) 75 MG TABS Take 75 mg by mouth daily.     VITAMIN D  PO Take 1 tablet by mouth daily.     albuterol  (VENTOLIN  HFA) 108 (90 Base) MCG/ACT inhaler Inhale 2 puffs into the lungs every 6 (six) hours as needed for wheezing or shortness of breath. (Patient not taking: Reported on 12/11/2023) 8 g 2   aspirin  EC 81 MG tablet Take 1 tablet (81 mg total) by  mouth daily. Swallow whole. (Patient not taking: Reported on 12/11/2023)     atenolol  (TENORMIN ) 25 MG tablet Take 0.5 tablets (12.5 mg total) by mouth at bedtime. (Patient not taking: Reported on 12/11/2023) 90 tablet 3   No facility-administered medications prior to visit.     Review of Systems:   Constitutional: No weight loss or gain, night sweats, fevers, chills, or lassitude. +fatigue  HEENT: No headaches CV:   No chest pain, palpitations, orthopnea, PND, swelling in lower extremities, anasarca, dizziness, syncope Resp: +occasional congestion; minimal shortness of breath with exertion; snoring. No excess mucus or change in  color of mucus. No productive or non-productive. No hemoptysis. No wheezing.  No chest wall deformity GI:  No heartburn, indigestion Skin: No rash, lesions, ulcerations MSK:  No joint pain or swelling.   Neuro: No dizziness or lightheadedness.  Psych: No depression or anxiety. Mood stable.     Physical Exam:  BP 119/70 (BP Location: Left Arm, Patient Position: Sitting)   Pulse 97   Ht 5' 7 (1.702 m)   Wt 136 lb (61.7 kg)   SpO2 96%   BMI 21.30 kg/m   GEN: Pleasant, interactive, well-appearing; in no acute distress HEENT:  Normocephalic and atraumatic. PERRLA. Sclera white. Nasal turbinates pink, moist and patent bilaterally. No rhinorrhea present. Oropharynx pink and moist, without exudate or edema. No lesions, ulcerations, or postnasal drip.  NECK:  Supple w/ fair ROM. No lymphadenopathy.   CV: RRR, no m/r/g, no peripheral edema. Pulses intact, +2 bilaterally. No cyanosis, pallor or clubbing. PULMONARY:  Unlabored, regular breathing. Clear bilaterally A&P w/o wheezes/rales/rhonchi. No accessory muscle use.  GI: BS present and normoactive. Soft, non-tender to palpation. No organomegaly or masses detected.  MSK: No erythema, warmth or tenderness. Cap refil <2 sec all extrem.  Neuro: A/Ox3. No focal deficits noted.   Skin: Warm, no lesions or rashe Psych: Normal affect and behavior. Judgement and thought content appropriate.     Lab Results:  CBC    Component Value Date/Time   WBC 4.8 06/20/2023 0925   WBC 9.4 01/22/2023 1410   RBC 4.52 06/20/2023 0925   RBC 4.30 01/22/2023 1410   HGB 12.9 06/20/2023 0925   HCT 38.9 06/20/2023 0925   PLT 257 06/20/2023 0925   MCV 86 06/20/2023 0925   MCH 28.5 06/20/2023 0925   MCH 28.4 01/22/2023 1410   MCHC 33.2 06/20/2023 0925   MCHC 31.5 01/22/2023 1410   RDW 12.0 06/20/2023 0925   LYMPHSABS 1.7 06/20/2023 0925   EOSABS 0.1 06/20/2023 0925   BASOSABS 0.0 06/20/2023 0925    BMET    Component Value Date/Time   NA 141  06/20/2023 0925   K 4.3 06/20/2023 0925   CL 103 06/20/2023 0925   CO2 25 06/20/2023 0925   GLUCOSE 80 06/20/2023 0925   GLUCOSE 135 (H) 01/22/2023 1410   BUN 16 06/20/2023 0925   CREATININE 0.75 06/20/2023 0925   CALCIUM  10.2 07/16/2023 0905   GFRNONAA >60 01/22/2023 1410   GFRAA 106 02/08/2020 1017    BNP No results found for: BNP   Imaging:  CT CARDIAC SCORING (SELF PAY ONLY) Addendum Date: 11/22/2023 ADDENDUM REPORT: 11/22/2023 19:20 EXAM: OVER-READ INTERPRETATION  CT CHEST The following report is an over-read performed by radiologist Dr. Fonda Mom [facility] on 11/22/2023. This over-read does not include interpretation of cardiac or coronary anatomy or pathology. The coronary calcium  score interpretation by the cardiologist is attached. COMPARISON:  08/14/2023. FINDINGS: Cardiovascular:  See findings  discussed in the body of the report. Mediastinum/Nodes: No suspicious adenopathy identified. Imaged mediastinal structures are unremarkable. Lungs/Pleura: Stable scarring and subsegmental atelectasis in the lingula and right middle lobe. No pleural effusion or pneumothorax. Upper Abdomen: No acute abnormality. Musculoskeletal: No chest wall abnormality. No acute osseous findings. IMPRESSION: No acute extracardiac incidental findings. Electronically Signed   By: Fonda Field M.D.   On: 11/22/2023 19:20   Result Date: 11/22/2023 : Cardiovascular Disease Risk stratification EXAM: Coronary Calcium  Score TECHNIQUE: A gated, non-contrast computed tomography scan of the heart was performed using 3mm slice thickness. Axial images were analyzed on a dedicated workstation. Calcium  scoring of the coronary arteries was performed using the Agatston method. FINDINGS: Coronary arteries: Normal origins. Coronary Calcium  Score: Left main: 0 Left anterior descending artery: 0 Left circumflex artery: 0 Right coronary artery: 0 Total: 0 Percentile: 0 Pericardium: Normal. Ascending  Aorta: Normal caliber. Non-cardiac: See separate report from [facility]. IMPRESSION: Coronary calcium  score of 0. This was 0 percentile for age-, race-, and sex-matched controls. RECOMMENDATIONS: Coronary artery calcium  (CAC) score is a strong predictor of incident coronary heart disease (CHD) and provides predictive information beyond traditional risk factors. CAC scoring is reasonable to use in the decision to withhold, postpone, or initiate statin therapy in intermediate-risk or selected borderline-risk asymptomatic adults (age 44-75 years and LDL-C >=70 to <190 mg/dL) who do not have diabetes or established atherosclerotic cardiovascular disease (ASCVD).* In intermediate-risk (10-year ASCVD risk >=7.5% to <20%) adults or selected borderline-risk (10-year ASCVD risk >=5% to <7.5%) adults in whom a CAC score is measured for the purpose of making a treatment decision the following recommendations have been made: If CAC=0, it is reasonable to withhold statin therapy and reassess in 5 to 10 years, as long as higher risk conditions are absent (diabetes mellitus, family history of premature CHD in first degree relatives (males <55 years; females <65 years), cigarette smoking, or LDL >=190 mg/dL). If CAC is 1 to 99, it is reasonable to initiate statin therapy for patients >=57 years of age. If CAC is >=100 or >=75th percentile, it is reasonable to initiate statin therapy at any age. Cardiology referral should be considered for patients with CAC scores >=400 or >=75th percentile. *2018 AHA/ACC/AACVPR/AAPA/ABC/ACPM/ADA/AGS/APhA/ASPC/NLA/PCNA Guideline on the Management of Blood Cholesterol: A Report of the American College of Cardiology/American Heart Association Task Force on Clinical Practice Guidelines. J Am Coll Cardiol. 2019;73(24):3168-3209. Kardie Tobb, DO The noncardiac portion of this study will be interpreted in separate report by the radiologist. Electronically Signed: By: Kardie  Tobb D.O. On:  11/22/2023 19:15    Administration History     None          Latest Ref Rng & Units 10/03/2023   10:04 AM  PFT Results  FVC-Pre L 2.90   FVC-Predicted Pre % 89   FVC-Post L 2.95   FVC-Predicted Post % 91   Pre FEV1/FVC % % 82   Post FEV1/FCV % % 82   FEV1-Pre L 2.36   FEV1-Predicted Pre % 95   FEV1-Post L 2.43   DLCO uncorrected ml/min/mmHg 20.90   DLCO UNC% % 100   DLCO corrected ml/min/mmHg 20.90   DLCO COR %Predicted % 100   DLVA Predicted % 119   TLC L 4.86   TLC % Predicted % 90   RV % Predicted % 83     No results found for: NITRICOXIDE      Assessment & Plan:      Bronchiectasis Waxing and waning nodules  with tree-in-bud nodularity and btx. Stable from prior imaging. No infectious symptoms or sputum production. Potential obstructive lung disease contributing to chest tightness and mild dyspnea. Findings are concerning for atypical indolent infection such as MAI; however, given lack of sputum production/infectious symptoms and no exacerbations, would recommend continued monitoring and obtain sputum culture/AFB PRN. Unless her nodules/symptoms change, would not recommend invasive procedures such as bronchoscopy at this point. Treatment for mycobacterial infection can be poorly tolerated and has a high recurrence risk so typically reserved for people with active symptoms of infection. PFT without obstructive lung disease. Relatively asymptomatic with occasional congestion. No significant benefit from SABA. Continue mucociliary clearance therapies at needed - consider addition of nebulized hypertonic saline but she would like to hold off for now. Prior immunoglobulin panel nl. Treatment decisions are collaborative. - Repeat CT chest in October  - Continue albuterol  PRN - Consider hypertonic saline PRN in the future - Notify of infectious symptoms and collect sputum cultures PRN  Lung nodules Appears to be related to inflammatory/infectious process. Lower  suspicion for neoplasm; however, not entirely able to rule out slow growing adenocarcinoma. There has been no progression thus far. Will continue to monitor with repeat CT imaging.  - See above   Chronic fatigue Persistent fatigue potentially related to mild sleep apnea. Lower suspicion that this is related to lung disease given normal PFT and lack of infectious symptoms. No weight loss or night sweats.  - Encouraged to work on graded exercises  Mild sleep apnea Mild OSA. Minimal cardiovascular risks. Discussed potential symptoms and treatment options. She would like to consider her options and will notify when she has decided on how she would like to proceed, whether it be positional sleeping, oral appliance or CPAP therapy. Safe driving practices reviewed. - lifestyle modification discussed - discussed how sleep disruption can increase risk of accidents, particularly when driving - safe driving practices were discussed       Advised if symptoms do not improve or worsen, to please contact office for sooner follow up or seek emergency care.   I spent 40 minutes of dedicated to the care of this patient on the date of this encounter to include pre-visit review of records, face-to-face time with the patient discussing conditions above, post visit ordering of testing, clinical documentation with the electronic health record, making appropriate referrals as documented, and communicating necessary findings to members of the patients care team.  Comer LULLA Rouleau, NP 12/11/2023  Pt aware and understands NP's role.

## 2023-12-11 NOTE — Patient Instructions (Addendum)
 Continue Albuterol  inhaler 2 puffs every 6 hours as needed for shortness of breath or wheezing. Notify if symptoms persist despite rescue inhaler/neb use.    Monitor sputum production/cough. Let us  know if you start having issues with a cough or begin coughing up any phlegm    Repeat CT chest in October 2025   Let me know about the sleep apnea treatments and what you decide. Mild sleep apnea has minimal impact on the heart but can cause fatigue    Follow up in October after CT chest with Dr. Shelah (new pt slot). If symptoms do not improve or worsen, please contact office for sooner follow up or seek emergency care.

## 2023-12-27 ENCOUNTER — Encounter (HOSPITAL_BASED_OUTPATIENT_CLINIC_OR_DEPARTMENT_OTHER): Payer: Self-pay

## 2023-12-27 DIAGNOSIS — G4733 Obstructive sleep apnea (adult) (pediatric): Secondary | ICD-10-CM

## 2023-12-27 NOTE — Telephone Encounter (Signed)
 Please advise on CPAP start and settings

## 2023-12-27 NOTE — Progress Notes (Signed)
 12/27/2023 Addendum: Pt contacted office and has decided to move forward with CPAP for mild OSA given symptom burden. Reviewed proper care/use of device. Order placed for auto CPAP 5-15 cmH2O, mask of choice and heated humidity. Safe driving practices reviewed.

## 2023-12-27 NOTE — Telephone Encounter (Signed)
 Auto CPAP 5-15 cmH2O, mask of choice and heated humidity. Addendum created to last OV. Please educated on proper care/use of device. Thanks!

## 2024-01-01 DIAGNOSIS — M6289 Other specified disorders of muscle: Secondary | ICD-10-CM | POA: Diagnosis not present

## 2024-01-01 DIAGNOSIS — N3946 Mixed incontinence: Secondary | ICD-10-CM | POA: Diagnosis not present

## 2024-01-01 DIAGNOSIS — M62838 Other muscle spasm: Secondary | ICD-10-CM | POA: Diagnosis not present

## 2024-01-01 DIAGNOSIS — M6281 Muscle weakness (generalized): Secondary | ICD-10-CM | POA: Diagnosis not present

## 2024-01-01 DIAGNOSIS — R159 Full incontinence of feces: Secondary | ICD-10-CM | POA: Diagnosis not present

## 2024-01-06 DIAGNOSIS — D225 Melanocytic nevi of trunk: Secondary | ICD-10-CM | POA: Diagnosis not present

## 2024-01-06 DIAGNOSIS — Z1283 Encounter for screening for malignant neoplasm of skin: Secondary | ICD-10-CM | POA: Diagnosis not present

## 2024-01-06 DIAGNOSIS — X32XXXD Exposure to sunlight, subsequent encounter: Secondary | ICD-10-CM | POA: Diagnosis not present

## 2024-01-06 DIAGNOSIS — B078 Other viral warts: Secondary | ICD-10-CM | POA: Diagnosis not present

## 2024-01-06 DIAGNOSIS — L570 Actinic keratosis: Secondary | ICD-10-CM | POA: Diagnosis not present

## 2024-01-07 ENCOUNTER — Telehealth: Payer: Self-pay

## 2024-01-07 NOTE — Telephone Encounter (Signed)
 Received CMN from [facility]. Placed on Katie's desk in B Pod for signature. Will fax back to [facility] once signed.

## 2024-01-09 DIAGNOSIS — M6281 Muscle weakness (generalized): Secondary | ICD-10-CM | POA: Diagnosis not present

## 2024-01-09 DIAGNOSIS — R159 Full incontinence of feces: Secondary | ICD-10-CM | POA: Diagnosis not present

## 2024-01-09 DIAGNOSIS — M6289 Other specified disorders of muscle: Secondary | ICD-10-CM | POA: Diagnosis not present

## 2024-01-09 DIAGNOSIS — N3946 Mixed incontinence: Secondary | ICD-10-CM | POA: Diagnosis not present

## 2024-01-10 ENCOUNTER — Telehealth: Payer: Self-pay

## 2024-01-10 NOTE — Telephone Encounter (Signed)
 Faxed signed CMN from Katie Cobb to [facility] at [phone]. Received fax confirmation, NFN.

## 2024-01-15 ENCOUNTER — Ambulatory Visit: Payer: Self-pay

## 2024-01-15 NOTE — Telephone Encounter (Signed)
 FYI Only or Action Required?: FYI only for provider.  Patient was last seen in primary care on 06/20/2023 by Severa Rock HERO, FNP.  Called Nurse Triage reporting Hand Pain and thumb redness, tingling, burning.  Symptoms began several days ago.  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  Symptoms are: gradually worsening.  Triage Disposition: See HCP Within 4 Hours (Or PCP Triage)  Patient/caregiver understands and will follow disposition?: No, wishes to speak with PCP      Copied from CRM #[id number]. Topic: Clinical - Red Word Triage >> Jan 15, 2024  9:23 AM Melinda Bennett wrote: Red Word that prompted transfer to Nurse Triage: flare up from having whitlow's syndrome it hurts,red,tingling,burning. Was taking valACYclovir  (VALTREX ) 1000 MG tablet [[id number]]  ENDED in the past Reason for Disposition  [1] Tingling (e.g., pins and needles) of the face, arm / hand, or leg / foot on one side of the body AND [2] present now  (Exceptions: Chronic/recurrent symptom lasting > 4 weeks; or from known cause, such as: bumped elbow, carpal tunnel, pinched nerve.)  Answer Assessment - Initial Assessment Questions 1. SYMPTOM: What is the main symptom you are concerned about? 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 ONSET: When did this start? (e.g., minutes, hours, days; while sleeping)     Probably 2 days ago but thought had gotten picker in it from pulling weeds 4. PATTERN Does this come and go, or has it been constant since it started?  Is it present now?     constant 5.  CARDIAC SYMPTOMS: Have you had any of the following symptoms: chest pain, difficulty breathing, palpitations?     No chest pain, SOB, palpitations 6. NEUROLOGIC SYMPTOMS: Have you had any of the following symptoms: headache, dizziness, vision loss, double vision, changes in speech, unsteady on your feet?     No headache, dizziness, changes in vision or speech 7. OTHER SYMPTOMS: Do you have any other symptoms?     Nope that is it, just the thumb   Advised pt be examined in next 4 hours, no PCP availability, scheduled earliest available for tomorrow per pt preference of PCP office, advised call back if worsening.  Protocols used: Neurologic Deficit-A-AH

## 2024-01-15 NOTE — Telephone Encounter (Signed)
 Apt scheduled.

## 2024-01-16 ENCOUNTER — Ambulatory Visit (INDEPENDENT_AMBULATORY_CARE_PROVIDER_SITE_OTHER): Admitting: Family Medicine

## 2024-01-16 VITALS — BP 134/84 | HR 90 | Temp 98.3°F | Ht 67.0 in | Wt 133.0 lb

## 2024-01-16 DIAGNOSIS — B0089 Other herpesviral infection: Secondary | ICD-10-CM

## 2024-01-16 MED ORDER — VALACYCLOVIR HCL 1 G PO TABS: 2000.0000 mg | ORAL_TABLET | Freq: Two times a day (BID) | ORAL | 5 refills | Status: AC

## 2024-01-19 ENCOUNTER — Encounter: Payer: Self-pay | Admitting: Family Medicine

## 2024-01-19 NOTE — Progress Notes (Signed)
 Subjective:  Patient ID: Melinda Bennett, female    DOB: 1955/05/30  Age: 68 y.o. MRN: [id number]  CC: left thumb tingling, pain   HPI  Discussed the use of AI scribe software for clinical note transcription with the patient, who gave verbal consent to proceed.  History of Present Illness Melinda Bennett is a 68 year old female with a history of herpes simplex who presents with a tingling sensation and redness on her thumb.  She has a tingling sensation and redness on her thumb. This episode began on Monday night. She has experienced similar occurrences in the past, often triggered by stress or illness.  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 She has experienced flare-ups only four or five times since then, typically under stress or illness. She recalls a flare-up in 2023 after being prescribed steroids for a respiratory issue.  She has been taking medication for the current flare-up, which she started on Monday night. The medication is old, but she has taken it twice a day for two and a half days. She recalls being prescribed similar medication in the past for herpes simplex flare-ups.  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          01/16/2024    1:42 PM 02/05/2023    2:49 PM 06/18/2022    9:18 AM  Depression screen PHQ 2/9  Decreased Interest 0 0 0  Down, Depressed, Hopeless 0 0 0  PHQ - 2 Score 0 0 0  Altered sleeping  0   Tired, decreased energy  0   Change in appetite  0   Feeling bad or failure about yourself   0   Trouble concentrating  0   Moving slowly or fidgety/restless  0   Suicidal thoughts  0   PHQ-9 Score  0   Difficult doing work/chores  Not difficult at all     History Melinda Bennett has a past medical history of Hyperlipidemia, Osteoporosis, and Rosacea.   She has a past surgical history that includes Gallbladder surgery; Breast biopsy (Left);  and Dental surgery.   Her family history includes Atrial fibrillation in her mother; Hypertension in her mother.She reports that she has never smoked. She has never used smokeless tobacco. She reports that she does not drink alcohol and does not use drugs.    ROS Review of Systems deferrred  Objective:  BP 134/84   Pulse 90   Temp 98.3 F (36.8 C)   Ht 5' 7 (1.702 m)   Wt 133 lb (60.3 kg)   SpO2 97%   BMI 20.83 kg/m   BP Readings from Last 3 Encounters:  01/16/24 134/84  12/11/23 119/70  10/22/23 116/64    Wt Readings from Last 3 Encounters:  01/16/24 133 lb (60.3 kg)  12/11/23 136 lb (61.7 kg)  10/22/23 135 lb (61.2 kg)     Physical Exam Skin:    Findings: Lesion (4 mm herpetiform eruption at tip of left thumb) present.      Assessment & Plan:  Herpes dermatitis  Other orders -     valACYclovir  HCl; Take 2 tablets (2,000 mg total) by mouth 2 (two) times daily. For cold sores  Dispense: 4 tablet; Refill: 5    Assessment and Plan Assessment & Plan Herpetic whitlow (herpes simplex infection of the thumb)   Recurrent herpetic whitlow on the thumb, likely triggered  by stress or recent outdoor work. 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 The current episode began on Monday night with tingling and pain, and she self-initiated treatment with older medication.  Recording duration: 3 minutes       Follow-up: Return if symptoms worsen or fail to improve.  Butler Der, M.D.

## 2024-01-20 ENCOUNTER — Telehealth (HOSPITAL_BASED_OUTPATIENT_CLINIC_OR_DEPARTMENT_OTHER): Payer: Self-pay

## 2024-01-20 ENCOUNTER — Other Ambulatory Visit (HOSPITAL_BASED_OUTPATIENT_CLINIC_OR_DEPARTMENT_OTHER): Payer: Self-pay

## 2024-01-20 DIAGNOSIS — J479 Bronchiectasis, uncomplicated: Secondary | ICD-10-CM

## 2024-01-20 NOTE — Telephone Encounter (Signed)
 Order placed and pt notified     Copied from CRM [id number]. Topic: Clinical - Lab/Test Results >> Jan 20, 2024 10:56 AM Melinda Bennett wrote: Reason for CRM: Patient states she needs Comer Rouleau to place a CT  order for her and send it to [facility] - patient states this was supposed to be ordered during her last appointment in July.

## 2024-02-05 DIAGNOSIS — N3946 Mixed incontinence: Secondary | ICD-10-CM | POA: Diagnosis not present

## 2024-02-05 DIAGNOSIS — R159 Full incontinence of feces: Secondary | ICD-10-CM | POA: Diagnosis not present

## 2024-02-05 DIAGNOSIS — M6281 Muscle weakness (generalized): Secondary | ICD-10-CM | POA: Diagnosis not present

## 2024-02-05 DIAGNOSIS — R351 Nocturia: Secondary | ICD-10-CM | POA: Diagnosis not present

## 2024-02-13 ENCOUNTER — Ambulatory Visit
Admission: RE | Admit: 2024-02-13 | Discharge: 2024-02-13 | Disposition: A | Discharge status: Home / Self Care | Source: Ambulatory Visit | Attending: Nurse Practitioner | Admitting: Nurse Practitioner

## 2024-02-13 DIAGNOSIS — R911 Solitary pulmonary nodule: Secondary | ICD-10-CM | POA: Diagnosis not present

## 2024-02-13 DIAGNOSIS — J479 Bronchiectasis, uncomplicated: Secondary | ICD-10-CM

## 2024-02-21 ENCOUNTER — Ambulatory Visit: Payer: Self-pay | Admitting: Nurse Practitioner

## 2024-02-26 NOTE — Progress Notes (Signed)
 Called and spoke to pt - advised of CT results per [facility]. Pt verbalized understanding, NFN.

## 2024-03-19 ENCOUNTER — Other Ambulatory Visit: Payer: Self-pay | Admitting: *Deleted

## 2024-04-01 ENCOUNTER — Ambulatory Visit (INDEPENDENT_AMBULATORY_CARE_PROVIDER_SITE_OTHER): Admitting: Emergency Medicine

## 2024-04-01 ENCOUNTER — Encounter: Payer: Self-pay | Admitting: Emergency Medicine

## 2024-04-01 VITALS — BP 132/74 | HR 104 | Ht 67.0 in | Wt 135.0 lb

## 2024-04-01 DIAGNOSIS — J479 Bronchiectasis, uncomplicated: Secondary | ICD-10-CM | POA: Diagnosis not present

## 2024-04-01 DIAGNOSIS — J209 Acute bronchitis, unspecified: Secondary | ICD-10-CM | POA: Insufficient documentation

## 2024-04-01 DIAGNOSIS — G4733 Obstructive sleep apnea (adult) (pediatric): Secondary | ICD-10-CM | POA: Diagnosis not present

## 2024-04-01 MED ORDER — DOXYCYCLINE HYCLATE 100 MG PO TABS
100.0000 mg | ORAL_TABLET | Freq: Two times a day (BID) | ORAL | 0 refills | Status: AC
Start: 2024-04-01 — End: ?

## 2024-04-01 NOTE — Assessment & Plan Note (Signed)
 Mild OSA is a new diagnosis.  She tried starting CPAP in October.  She is not sure whether it is helping her.  She does feel that the humidification has changed her breathing, cough somewhat.  I have asked her to continue the CPAP for another month so we can judge its clinical benefit.

## 2024-04-01 NOTE — Progress Notes (Signed)
 Subjective:    Patient ID: Melinda Bennett, female    DOB: [birth year], 68 y.o.   MRN: [id number]  HPI Melinda Bennett is a 68 year old never smoker, retired engineer, agricultural, who has been followed in our office for calcified granulomata and adenopathy, waxing and waning pulm disease in the setting of bronchiectasis.  She had COVID-19 in August 2024 and that is when her CT chest was performed and bronchiectasis was identified. She is not currently on any bronchodilator regimen She reports today that she can occasionally hear some rattling in her chest. Has some occasional cough when she lays down. She has tried mucinex, fluids to prompt a sputum cx for AFB but has been unable. She is experiencing some fatigue beginning earlier this year. Does not have a daily cough. She was dx w mild OSA, started CPAP in October - she is unsure whether it is doing anything for her, does not sleep through the night. She has some increased chest tightness and some cough since starting it.   CT chest 02/13/2024 reviewed by me, shows no enlarged mediastinal or hilar nodes.  There is densely calcified benign pretracheal and right hilar lymphadenopathy present.  An elongated nodule in the superior segment left lower lobe is slightly decreased in size compared with April 2025 now 1.3 x 0.5 cm.  There is some fibrotic bronchiectatic change and scar in the posterior lingula, scattered clustered centrilobular tree-in-bud nodularity that also has waxed and waned.  PFT 10/03/23 reviewed by me.    Review of Systems As per HPI  Past Medical History:  Diagnosis Date   Hyperlipidemia    Osteoporosis    Rosacea      Family History  Problem Relation Age of Onset   Hypertension Mother    Atrial fibrillation Mother    Breast cancer Neg Hx      Social History   Socioeconomic History   Marital status: Married    Spouse name: Programme Researcher, Broadcasting/film/video   Number of children: 4   Years of education: 16   Highest education level: Master's degree (e.g.,  MA, MS, MEng, MEd, MSW, MBA)  Occupational History   Not on file  Tobacco Use   Smoking status: Never   Smokeless tobacco: Never  Vaping Use   Vaping status: Never Used  Substance and Sexual Activity   Alcohol use: Never   Drug use: Never   Sexual activity: Yes    Comment: married  since 1982, 4 children all grown  Other Topics Concern   Not on file  Social History Narrative   Not on file   Social Drivers of Health   Financial Resource Strain: Low Risk  (01/16/2024)   Overall Financial Resource Strain (CARDIA)    Difficulty of Paying Living Expenses: Not hard at all  Food Insecurity: No Food Insecurity (01/16/2024)   Hunger Vital Sign    Worried About Running Out of Food in the Last Year: Never true    Ran Out of Food in the Last Year: Never true  Transportation Needs: No Transportation Needs (01/16/2024)   PRAPARE - Administrator, Civil Service (Medical): No    Lack of Transportation (Non-Medical): No  Physical Activity: Unknown (01/16/2024)   Exercise Vital Sign    Days of Exercise per Week: 4 days    Minutes of Exercise per Session: Not on file  Stress: No Stress Concern Present (01/16/2024)   [employer] of Occupational Health - Occupational Stress Questionnaire    Feeling of Stress: Not  at all  Social Connections: Socially Integrated (01/16/2024)   Social Connection and Isolation Panel    Frequency of Communication with Friends and Family: More than three times a week    Frequency of Social Gatherings with Friends and Family: Once a week    Attends Religious Services: More than 4 times per year    Active Member of [employer] or Organizations: Yes    Attends Banker Meetings: More than 4 times per year    Marital Status: Married  Catering Manager Violence: Unknown (08/17/2021)   Received from [facility]   HITS    Physically Hurt: Not on file    Insult or Talk Down To: Not on file    Threaten Physical Harm: Not on file    Scream or Curse: Not on  file    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  Has lived in [place] , [place] , [place] area, [place]  and then [place] .  No known radon exposure  Allergies  Allergen Reactions   Amoxicillin Rash   Penicillins Rash     Outpatient Medications Prior to Visit  Medication Sig Dispense Refill   alendronate  (FOSAMAX ) 70 MG tablet TAKE 1 TABLET ONCE A WEEK WITH A FULL GLASS OF WATER ON EMPTY STOMACH 12 tablet 1   Calcium  Carbonate-Vit D-Min (CALCIUM  1200 PO) Take by mouth daily.     rosuvastatin  (CRESTOR ) 5 MG tablet 3-5 times weekly as tolerated 80 tablet 3   valACYclovir  (VALTREX ) 1000 MG tablet Take 2 tablets (2,000 mg total) by mouth 2 (two) times daily. For cold sores 4 tablet 5   Vibegron (GEMTESA) 75 MG TABS Take 75 mg by mouth daily.     VITAMIN D  PO Take 1 tablet by mouth daily.     No facility-administered medications prior to visit.         Objective:   Physical Exam  Vitals:   04/01/24 0949  BP: 132/74  Pulse: (!) 104  SpO2: 94%  Weight: 135 lb (61.2 kg)  Height: 5' 7 (1.702 m)    Gen: Pleasant, well-nourished, in no distress,  normal affect  ENT: No lesions,  mouth clear,  oropharynx clear, no postnasal drip  Neck: No JVD, no stridor  Lungs: No use of accessory muscles, no crackles or wheezing on normal respiration, no wheeze on forced expiration  Cardiovascular: RRR, heart sounds normal, no murmur or gallops, no peripheral edema  Musculoskeletal: No deformities, no cyanosis or clubbing  Neuro: alert, awake, non focal  Skin: Warm, no lesions or rash      Assessment & Plan:  Acute bronchitis Increased cough and some low-grade fever, slight increase in mucus.  Will plan to treat her for an acute bronchitis with doxycycline .  I do not think this is an overt bronchiectasis flare but we did discuss that she is at risk.  Her CT chest actually shows very minimal bronchiectasis except for focal area in the  lingula  Bronchiectasis without complication (HCC) Principally in the lingula with some focal scarring volume loss.  Scattered pulmonary nodular disease stable or improved compared with 08/2023.  We talked about the pros and cons of bronchoscopy.  Her CT chest has been stable, overall her symptoms have been stable as well though she does show signs over the last week of an acute bronchitis.  Will treat her for this and if she remains stable we can probably continue to just follow clinically and radiographically  We reviewed  your CT scan of the chest today. We will plan to repeat your CT chest in October 2026.  If you develop any new respiratory or constitutional symptoms we may decide to repeat the scan sooner. Please take doxycycline  as directed for 7 days until completely gone. Keep your albuterol  available to use 2 puffs if needed for shortness of breath, chest tightness, wheezing. Follow Dr. Shelah in 6 months If you continue to have persistent cough, shortness of breath then please call so we can see you sooner  Obstructive sleep apnea Mild OSA is a new diagnosis.  She tried starting CPAP in October.  She is not sure whether it is helping her.  She does feel that the humidification has changed her breathing, cough somewhat.  I have asked her to continue the CPAP for another month so we can judge its clinical benefit.  I personally spent a total of 65 minutes in the care of the patient today including preparing to see the patient, getting/reviewing separately obtained history, performing a medically appropriate exam/evaluation, counseling and educating, placing orders, documenting clinical information in the EHR, independently interpreting results, and communicating results.   Lamar Shelah, MD, PhD 04/01/2024, 12:06 PM [facility] [phone] or if no answer before 7:00PM call [phone] For any issues after 7:00PM please call eLink [phone]

## 2024-04-01 NOTE — Assessment & Plan Note (Signed)
 Principally in the lingula with some focal scarring volume loss.  Scattered pulmonary nodular disease stable or improved compared with 08/2023.  We talked about the pros and cons of bronchoscopy.  Her CT chest has been stable, overall her symptoms have been stable as well though she does show signs over the last week of an acute bronchitis.  Will treat her for this and if she remains stable we can probably continue to just follow clinically and radiographically  We reviewed your CT scan of the chest today. We will plan to repeat your CT chest in October 2026.  If you develop any new respiratory or constitutional symptoms we may decide to repeat the scan sooner. Please take doxycycline  as directed for 7 days until completely gone. Keep your albuterol  available to use 2 puffs if needed for shortness of breath, chest tightness, wheezing. Follow Dr. Shelah in 6 months If you continue to have persistent cough, shortness of breath then please call so we can see you sooner

## 2024-04-01 NOTE — Patient Instructions (Signed)
 We reviewed your CT scan of the chest today. We will plan to repeat your CT chest in October 2026.  If you develop any new respiratory or constitutional symptoms we may decide to repeat the scan sooner. Please take doxycycline  as directed for 7 days until completely gone. Keep your albuterol  available to use 2 puffs if needed for shortness of breath, chest tightness, wheezing. Would try to continue to wear your CPAP reliably every night for at least another month so you can decide whether you feel you are getting clinical benefit. Follow Dr. Shelah in 6 months If you continue to have persistent cough, shortness of breath then please call so we can see you sooner

## 2024-04-01 NOTE — Assessment & Plan Note (Signed)
 Increased cough and some low-grade fever, slight increase in mucus.  Will plan to treat her for an acute bronchitis with doxycycline .  I do not think this is an overt bronchiectasis flare but we did discuss that she is at risk.  Her CT chest actually shows very minimal bronchiectasis except for focal area in the lingula

## 2024-04-22 DIAGNOSIS — M6281 Muscle weakness (generalized): Secondary | ICD-10-CM | POA: Diagnosis not present

## 2024-04-22 DIAGNOSIS — M62838 Other muscle spasm: Secondary | ICD-10-CM | POA: Diagnosis not present

## 2024-04-22 DIAGNOSIS — K5909 Other constipation: Secondary | ICD-10-CM | POA: Diagnosis not present

## 2024-04-29 ENCOUNTER — Encounter: Payer: Self-pay | Admitting: Nurse Practitioner

## 2024-04-29 ENCOUNTER — Ambulatory Visit: Admitting: Nurse Practitioner

## 2024-04-29 ENCOUNTER — Telehealth: Payer: Self-pay

## 2024-04-29 VITALS — BP 110/60 | HR 91 | Temp 97.4°F | Ht 67.0 in | Wt 135.8 lb

## 2024-04-29 DIAGNOSIS — J479 Bronchiectasis, uncomplicated: Secondary | ICD-10-CM

## 2024-04-29 DIAGNOSIS — G4733 Obstructive sleep apnea (adult) (pediatric): Secondary | ICD-10-CM | POA: Diagnosis not present

## 2024-04-29 NOTE — Progress Notes (Signed)
 @Patient  ID: Melinda Bennett, female    DOB: 1955-07-22, 68 y.o.   MRN: [id number]  Chief Complaint  Patient presents with   Obstructive Sleep Apnea    F/U     Referring provider: Severa Rock HERO, FNP  HPI: 68 year female, never smoker followed for bronchiectasis, lung nodules, and mild OSA. She was last seen in office 04/01/2024. Past medical history significant for HLD, osteoporosis.   Had COVID in August 2024 and seen in the emergency room 01/22/2023.  Had a CTA of the chest that ruled out pulmonary embolism but found an incidental lung nodule.  She was seen by Dr. Geronimo 03/17/2024 with repeat CT imaging showing waxing and waning bilateral pulmonary nodules with tree-in-bud nodularity.  TEST/EVENTS:  01/22/2023 CTA chest: No PE.  Calcified mediastinal and right hilar nodes, normal size.  Multiple right lung calcified granulomata.  Mild biapical pleural and parenchymal scarring.  Lingular cylindrical and cystic btx.  4 mm left upper lobe nodule.  Larger, more linear shaped nodular density in left lower lobe 17 x 8 mm 2 mm subpleural nodule similar sized nodule in the left upper lobe 05/13/2023 super D CT chest: Atherosclerosis.  Calcified nodal tissue in precarinal and right hilum space.  Biapical pleural-parenchymal scarring.  Bilateral pulmonary nodules, largest of which 15 x 7 mm and stable compared to prior scan.  Tree-in-bud nodularity, new in interval.  Dominant nodule in this cluster measures 5 mm.  Tree-in-bud nodularity with areas of mucoid impaction in left upper lobe and new elongated nodular opacity measuring 14 x 5 mm.  Adjacent 5 mm left upper lobe nodule, similar to prior.  Lingular scarring with associated BTX 08/14/2023 CT chest: Atherosclerosis/CAD.  Calcified right paratracheal and right hilar nodes.  Bronchiectasis, tree-in-bud nodularity and mucoid impaction, favors MAI.  Stable appearance of multiple bilateral lung nodules, largest 1.4 cm and left lower lobe. 10/04/2023 HST:  AHI 8.5/h, SpO2 low 86%  06/13/2023: OV with Groce NP.  Review of CT of the chest shows some waxing and waning lung nodules, most likely infectious or inflammatory.  Notation of bronchiectasis on CT as well.  Does have a cough but unable to mobilize secretions.  She does have a sensation of secretions moving in her chest when she lies on her side.  Will start her on mucociliary clearance therapy with Mucinex and flutter valve.  Plan to repeat CT chest in 3 months to ensure stability.  If she develops sputum production, will culture secretions.  09/25/2023: Today - follow up Discussed the use of AI scribe software for clinical note transcription with the patient, who gave verbal consent to proceed. Melinda Bennett is a 68 year old female with bronchiectasis who presents for follow up.  She experiences ongoing fatigue and chest tightness, feeling tired upon waking despite adequate sleep and needing to pace herself during activities. 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 No cough is present. Doesn't have chest congestion.  She has a history of bronchiectasis and lung nodules. Despite attempts with Mucinex, increased water intake, and using a flutter valve, there is no significant sputum production. She also tried percussive techniques and postural drainage without success and discontinued Mucinex after a while due to gastrointestinal side effects. She had COVID-19 in August 2024, followed by an episode of tachycardia a month later, leading to an ER visit  where a CT scan ruled out pulmonary embolism but incidental findings of scattered nodularity and btx.  No productive cough, fever, weight loss, night sweats, or hemoptysis. Never used any inhalers.  She was previously on atenolol  for post-COVID tachycardia but has since weaned off it at the end of  March. She reports occasional tachycardia during activities but no longer experiences nighttime palpitations like she was before. She does have some snoring from what she's been told. Never had any issues with drowsy driving. No morning headaches or witnessed apneas. She's never had a sleep study before.     12/11/2023: Ov with Damarco Keysor NP Melinda Bennett is a 68 year old female with mild sleep apnea and bronchiectasis who presents for follow-up on her sleep study and lung function. She underwent a sleep study revealing mild sleep apnea with an average of eight respiratory events per hour. She experiences about thirty snores per hour, which are mild to moderate in loudness. She has been sleeping on her stomach more but causes some discomfort from time to time so she will roll over. She clenches her teeth during sleep. Has some occasional fatigue. No issues with drowsy driving or morning headaches.  She has a history of bronchiectasis, discovered incidentally following a CT scan after experiencing tachycardia post-COVID infection. She did not have respiratory symptoms during her COVID infection. She does not experience a chronic productive cough. She occasionally feels a sensation of mucus in her throat, especially when lying down, but is unable to cough anything up. No significant cough during the day. Doesn't really feel like she has short windedness. Sometimes just decreased stamina/fatigue. Although, the heat does seem to make her activity tolerance worse. She has tried albuterol  without noticeable improvement in symptoms. No fevers, chills, hemoptysis, weight loss, fatigue.   04/01/2024: OV with Dr. Shelah. Occasionally hears some rattling in her chest. Some occasional cough when she lays down. Has tried mucinex, fluids to prompt sputum culture but no success. Some fatigue. Does have a daily cough. Dx with mild OSA, started CPAP in October. Unsure whether this is making a difference. Currently having some  increased chest tightness and some cough. Btx is principally in lingula; scattered nodular disease stable or improved compared to 08/2023. Reviewed pros and cons of bronchoscopy. Given symptoms have been stable, will hold off and just monitor clinically and radiographically. Treat for acute symptoms with doxycyline course. Keep albuterol  PRN. Encouraged to continue using CPAP since she is getting some benefit regarding her cough from It.   04/29/2024: Today - follow up Discussed the use of AI scribe software for clinical note transcription with the patient, who gave verbal consent to proceed.  History of Present Illness  Melinda Bennett is a 68 year old female who presents for follow-up regarding her CPAP use and recent respiratory symptoms.  She experienced an increased cough about a month ago, which improved with doxycycline  treatment. She has felt better. Has had a little more congestion over the last few days. No increased cough, wheezing, shortness of breath, fevers, hemoptysis, night sweats.  Her husband recently recovered from an illness around Thanksgiving, but he did not test positive for COVID-19.  She has been using a CPAP machine and finds the humidification beneficial but isn't sure if it makes a difference otherwise. She has occasional fatigue. No significant sleep disturbances. No drowsy driving. She thinks she will try herself off of it for a week or so, and see how she feels.      Allergies  Allergen Reactions   Amoxicillin Rash   Penicillins Rash    Immunization History  Administered Date(s) Administered   Fluad Quad(high Dose 65+) 02/08/2020, 03/21/2022   Fluad Trivalent(High Dose 65+) 03/05/2023   INFLUENZA, HIGH DOSE SEASONAL PF 03/09/2024   Influenza,inj,Quad PF,6+ Mos 02/27/2018   Influenza-Unspecified 02/20/2021   Moderna SARS-COV2 Booster Vaccination 04/01/2020   Moderna Sars-Covid-2 Vaccination 08/31/2019, 09/28/2019   PNEUMOCOCCAL CONJUGATE-20 06/15/2021   Tdap  02/08/2020   Zoster Recombinant(Shingrix ) 10/30/2018, 03/31/2019    Past Medical History:  Diagnosis Date   Hyperlipidemia    Osteoporosis    Rosacea     Tobacco History: Social History   Tobacco Use  Smoking Status Never  Smokeless Tobacco Never   Counseling given: Not Answered   Outpatient Medications Prior to Visit  Medication Sig Dispense Refill   albuterol  (VENTOLIN  HFA) 108 (90 Base) MCG/ACT inhaler Inhale 2 puffs into the lungs every 6 (six) hours as needed.     alendronate  (FOSAMAX ) 70 MG tablet TAKE 1 TABLET ONCE A WEEK WITH A FULL GLASS OF WATER ON EMPTY STOMACH 12 tablet 1   Calcium  Carbonate-Vit D-Min (CALCIUM  1200 PO) Take by mouth daily.     rosuvastatin  (CRESTOR ) 5 MG tablet 3-5 times weekly as tolerated 80 tablet 3   valACYclovir  (VALTREX ) 1000 MG tablet Take 2 tablets (2,000 mg total) by mouth 2 (two) times daily. For cold sores 4 tablet 5   Vibegron (GEMTESA) 75 MG TABS Take 75 mg by mouth daily.     VITAMIN D  PO Take 1 tablet by mouth daily.     doxycycline  (VIBRA -TABS) 100 MG tablet Take 1 tablet (100 mg total) by mouth 2 (two) times daily. 14 tablet 0   No facility-administered medications prior to visit.     Review of Systems: as above    Physical Exam:  BP 110/60   Pulse 91   Temp (!) 97.4 F (36.3 C)   Ht 5' 7 (1.702 m) Comment: Per pt  Wt 135 lb 12.8 oz (61.6 kg)   SpO2 97% Comment: RA  BMI 21.27 kg/m   GEN: Pleasant, interactive, well-appearing; in no acute distress HEENT:  Normocephalic and atraumatic. PERRLA. Sclera white. Nasal turbinates pink, moist and patent bilaterally. No rhinorrhea present. Oropharynx pink and moist, without exudate or edema. No lesions, ulcerations, or postnasal drip.  NECK:  Supple w/ fair ROM. No lymphadenopathy.   CV: RRR, no m/r/g, no peripheral edema. Pulses intact, +2 bilaterally. No cyanosis, pallor or clubbing. PULMONARY:  Unlabored, regular breathing. Clear bilaterally A&P w/o wheezes/rales/rhonchi.  No accessory muscle use.  GI: BS present and normoactive. Soft, non-tender to palpation.  MSK: No erythema, warmth or tenderness. Cap refil <2 sec all extrem.  Neuro: A/Ox3. No focal deficits noted.   Skin: Warm, no lesions or rashe Psych: Normal affect and behavior. Judgement and thought content appropriate.     Lab Results:  CBC    Component Value Date/Time   WBC 4.8 06/20/2023 0925   WBC 9.4 01/22/2023 1410   RBC 4.52 06/20/2023 0925   RBC 4.30 01/22/2023 1410   HGB 12.9 06/20/2023 0925   HCT 38.9 06/20/2023 0925   PLT 257 06/20/2023 0925   MCV 86 06/20/2023 0925   MCH 28.5 06/20/2023 0925   MCH 28.4 01/22/2023 1410   MCHC 33.2 06/20/2023 0925   MCHC 31.5 01/22/2023 1410   RDW 12.0 06/20/2023 0925   LYMPHSABS 1.7 06/20/2023 0925   EOSABS 0.1 06/20/2023 0925   BASOSABS 0.0 06/20/2023 0925  BMET    Component Value Date/Time   NA 141 06/20/2023 0925   K 4.3 06/20/2023 0925   CL 103 06/20/2023 0925   CO2 25 06/20/2023 0925   GLUCOSE 80 06/20/2023 0925   GLUCOSE 135 (H) 01/22/2023 1410   BUN 16 06/20/2023 0925   CREATININE 0.75 06/20/2023 0925   CALCIUM  10.2 07/16/2023 0905   GFRNONAA >60 01/22/2023 1410   GFRAA 106 02/08/2020 1017    BNP No results found for: BNP   Imaging:  No results found.   Administration History     None          Latest Ref Rng & Units 10/03/2023   10:04 AM  PFT Results  FVC-Pre L 2.90   FVC-Predicted Pre % 89   FVC-Post L 2.95   FVC-Predicted Post % 91   Pre FEV1/FVC % % 82   Post FEV1/FCV % % 82   FEV1-Pre L 2.36   FEV1-Predicted Pre % 95   FEV1-Post L 2.43   DLCO uncorrected ml/min/mmHg 20.90   DLCO UNC% % 100   DLCO corrected ml/min/mmHg 20.90   DLCO COR %Predicted % 100   DLVA Predicted % 119   TLC L 4.86   TLC % Predicted % 90   RV % Predicted % 83     No results found for: NITRICOXIDE      Assessment & Plan:      Bronchiectasis Waxing and waning nodules with tree-in-bud nodularity and  btx. Stable from prior imaging. Slight increase in congestion but no other infectious symptoms. Potential obstructive lung disease contributing to chest tightness and mild dyspnea. Findings are concerning for atypical indolent infection such as MAI; however, given lack of sputum production/infectious symptoms and no exacerbations, would recommend continued monitoring and obtain sputum culture/AFB PRN. Unless her nodules/symptoms change, would not recommend invasive procedures such as bronchoscopy at this point. Prior PFT without obstructive lung disease. Relatively asymptomatic with occasional congestion. Slight increase in congestion recently so will trial her on hypertonic saline and flutter valve. No significant benefit from prior SABA. Prior immunoglobulin panel nl. Action plan in place.  - Continue albuterol  PRN - Start hypertonic saline nebs PRN and flutter valve PRN - Notify of infectious symptoms and collect sputum cultures PRN  Lung nodules Appears to be related to inflammatory/infectious process. Stable to improved on recent imaging. - Will continue to monitor with repeat CT imaging.   Chronic fatigue Persistent fatigue without significant change despite CPAP therapy. Does not impact daily activities. No weight loss or night sweats.  - Encouraged to work on graded exercises  Mild sleep apnea Mild OSA. Minimal cardiovascular risks. Will trial off CPAP for 1-2 weeks to see if she notices any difference off therapy. If so, she will resume CPAP therapy. Safe driving practices reviewed. - lifestyle modification discussed - discussed how sleep disruption can increase risk of accidents, particularly when driving - safe driving practices were discussed       Advised if symptoms do not improve or worsen, to please contact office for sooner follow up or seek emergency care.   I spent 35 minutes of dedicated to the care of this patient on the date of this encounter to include pre-visit review  of records, face-to-face time with the patient discussing conditions above, post visit ordering of testing, clinical documentation with the electronic health record, making appropriate referrals as documented, and communicating necessary findings to members of the patients care team.  Comer LULLA Rouleau, NP 04/29/2024  Pt  aware and understands NP's role.

## 2024-04-29 NOTE — Patient Instructions (Addendum)
-  Continue Albuterol  inhaler 2 puffs every 6 hours as needed for shortness of breath or wheezing. Notify if symptoms persist despite rescue inhaler/neb use.  -Start hypertonic saline nebulizer treatment Twice daily as needed for cough/congestion then follow with flutter valve 10 times. If you're not having any mucus, you don't have to use these    Continue to use CPAP every night, minimum of 4-6 hours a night.  Change equipment as directed. Wash your tubing with warm soap and water daily, hang to dry. Wash humidifier portion weekly. Use bottled, distilled water and change daily Be aware of reduced alertness and do not drive or operate heavy machinery if experiencing this or drowsiness.  Exercise encouraged, as tolerated. Healthy weight management discussed.  Avoid or decrease alcohol consumption and medications that make you more sleepy, if possible. Notify if persistent daytime sleepiness occurs even with consistent use of PAP therapy.  Change CPAP supplies... Every month Mask cushions and/or nasal pillows CPAP machine filters Every 3 months Mask frame (not including the headgear) CPAP tubing Every 6 months Mask headgear Chin strap (if applicable) Humidifier water tub   Ok to try yourself off for a week and see how you feel. If you notice more fatigue, restless sleep, etc, go back on the CPAP. There are minimal cardiovascular risks associated with mild sleep apnea   Follow up in 5 months with Dr. Shelah. If symptoms do not improve or worsen, please contact office for sooner follow up or seek emergency care.

## 2024-04-29 NOTE — Telephone Encounter (Signed)
 Copied from CRM #[id number]. Topic: Appointments - Scheduling Inquiry for Clinic >> Apr 29, 2024 12:11 PM Santiya F wrote: Reason for CRM: Patient is calling in because she would like to schedule a mammogram. Please follow up with patient.

## 2024-05-04 ENCOUNTER — Telehealth: Payer: Self-pay

## 2024-05-04 NOTE — Telephone Encounter (Signed)
 Copied from CRM #[id number]. Topic: Clinical - Medical Advice >> May 01, 2024  4:18 PM Melinda Bennett wrote: Reason for CRM: Patient states her insurance wont cover her CPAP machine due to the notes submitted stating it hasnt been helping her - pt was told to tell provider to send an addendum with her numbers stating it has been helping. Requesting Bennett call back for an update.    Callback number: [phone]   [facility] can we fax ov note from 12/17 to [facility] stating to continue CPAP machine. I tried faxing this and it was unsuccessful.

## 2024-05-04 NOTE — Telephone Encounter (Signed)
 Spoke with patient to inform her the 04/29/24 office note from Katherin Cobb, NP was being faxed to [facility].  She voiced her understanding

## 2024-05-05 ENCOUNTER — Telehealth: Payer: Self-pay | Admitting: Nurse Practitioner

## 2024-05-05 NOTE — Telephone Encounter (Signed)
 The patient and I discussed this at her OV. She was unsure if she was receiving benefit from the CPAP in terms of more restful sleep/daytime energy. The plan was to trial off of it for a week or two and then reassess response. Will need to verify with the patient as that is what is stated in my note.  She's utilizing therapy and compliant with it. She has had a benefit from use of it, which is documented, and the DME cannot dictate that this is not related to her OSA management. That's not up for their interpretation. That is all they should need.

## 2024-05-05 NOTE — Telephone Encounter (Signed)
 Per Harlene with [facility]: pt insurance requires she has a follow-up appt to discuss use and benefit, her appt on 12/17 only notes benefit from Humidification which not a benefit of the actual cpap therapy controlling her OSA.  The patient told us  she did discuss at that appt that she was benefitting from cpap therapy.  She was to call you to discuss if an addendum could be made,  without this or a new office visit being done that discuss use and benefit her insurance will not continue to cover the equipment.  Would it be possible to get an addendum to the notes?   This was a push back from the DME company Please advise

## 2024-05-15 ENCOUNTER — Other Ambulatory Visit: Payer: Self-pay | Admitting: Family Medicine

## 2024-05-15 DIAGNOSIS — Z1231 Encounter for screening mammogram for malignant neoplasm of breast: Secondary | ICD-10-CM

## 2024-05-18 ENCOUNTER — Inpatient Hospital Stay
Admission: RE | Admit: 2024-05-18 | Discharge: 2024-05-18 | Disposition: A | Source: Ambulatory Visit | Attending: Family Medicine

## 2024-05-18 ENCOUNTER — Telehealth (HOSPITAL_BASED_OUTPATIENT_CLINIC_OR_DEPARTMENT_OTHER): Payer: Self-pay | Admitting: Nurse Practitioner

## 2024-05-18 DIAGNOSIS — Z1231 Encounter for screening mammogram for malignant neoplasm of breast: Secondary | ICD-10-CM

## 2024-05-18 NOTE — Telephone Encounter (Signed)
 Per DME;  pt insurance requires she has a follow-up appt to discuss use and benefit, her appt on 12/17 only notes benefit from Humidification which not a benefit of the actual cpap therapy controlling her OSA.  The patient told us  she did discuss at that appt that she was benefitting from cpap therapy.  She was to call you to discuss if an addendum could be made,  without this or a new office visit being done that discuss use and benefit her insurance will not continue to cover the equipment.  Would it be possible to get an addendum to the notes?  Please advice

## 2024-05-18 NOTE — Telephone Encounter (Signed)
 Please see telephone encounter from 12/23

## 2024-05-19 ENCOUNTER — Telehealth: Payer: Self-pay

## 2024-05-19 NOTE — Telephone Encounter (Signed)
 Printed and faxed to [facility] urology

## 2024-05-20 ENCOUNTER — Ambulatory Visit: Payer: Self-pay | Admitting: Family Medicine

## 2024-06-05 ENCOUNTER — Telehealth: Payer: Self-pay

## 2024-06-05 MED ORDER — SODIUM CHLORIDE 3 % IN NEBU: INHALATION_SOLUTION | RESPIRATORY_TRACT | 5 refills | Status: AC

## 2024-06-05 NOTE — Telephone Encounter (Signed)
 Copied from CRM #[id number]. Topic: Clinical - Medication Question >> Jun 04, 2024 11:59 AM [name] wrote: Reason for CRM: pt stated she is not showing the Saline that provider prescribed for her, she would like a call back about the Saline please >> Jun 05, 2024 12:37 PM [name] wrote: Patient calling to follow up saline or what she can use in her nebulizer. Please call patient or respond in MyChart.    Called and spoke to pt. Advised pt that I am forwarding her message. Pt verbalized understanding. I am not seeing where any nebulizer solution has been sent for pt.  Routing to [place], as she is covering [employer]. Previous [place] pt.   Tammy, please advise on nebulizer solution for pt. [id number] - [place], [place] - [address]

## 2024-06-05 NOTE — Telephone Encounter (Signed)
 Cobb LOV , hypertonic neb started . Will send rx

## 2024-06-09 ENCOUNTER — Telehealth: Payer: Self-pay

## 2024-06-09 NOTE — Telephone Encounter (Signed)
 Received faxed CMN from [facility] for nebulizer supplies. Placed into Tammy's sign folder in B Pod. Once signed, will fax back to [facility] at [phone]

## 2024-06-10 NOTE — Telephone Encounter (Signed)
 Faxed signed CMN to [facility] at [phone]. Fax confirmation received, NFN.

## 2024-06-16 ENCOUNTER — Telehealth: Payer: Self-pay

## 2024-06-16 NOTE — Telephone Encounter (Signed)
 Received faxed CMN from [facility] for nebulizer supplies. Placed into Tammy's sign folder in B Pod. Once signed, will fax back to [facility] at [phone]

## 2024-06-23 ENCOUNTER — Encounter: Payer: Medicare Other | Admitting: Family Medicine

## 2024-09-30 ENCOUNTER — Encounter: Admitting: Emergency Medicine
